# Patient Record
Sex: Female | Born: 2000 | Race: White | Hispanic: No | Marital: Married | Smoking: Never smoker
Health system: Southern US, Community
[De-identification: ages and names within clinical notes are randomized; demographics above are authoritative.]

## PROBLEM LIST (undated history)

## (undated) DIAGNOSIS — J189 Pneumonia, unspecified organism: Secondary | ICD-10-CM

## (undated) DIAGNOSIS — K219 Gastro-esophageal reflux disease without esophagitis: Secondary | ICD-10-CM

## (undated) DIAGNOSIS — F4321 Adjustment disorder with depressed mood: Secondary | ICD-10-CM

## (undated) DIAGNOSIS — J020 Streptococcal pharyngitis: Secondary | ICD-10-CM

## (undated) DIAGNOSIS — J309 Allergic rhinitis, unspecified: Secondary | ICD-10-CM

## (undated) HISTORY — PX: HAND SURGERY: SHX662

## (undated) HISTORY — PX: WISDOM TOOTH EXTRACTION: SHX21
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
## 2019-02-08 ENCOUNTER — Other Ambulatory Visit: Payer: Self-pay

## 2019-02-08 DIAGNOSIS — Z20822 Contact with and (suspected) exposure to covid-19: Secondary | ICD-10-CM

## 2019-02-10 LAB — NOVEL CORONAVIRUS, NAA: SARS-CoV-2, NAA: NOT DETECTED

## 2019-03-03 ENCOUNTER — Encounter (HOSPITAL_COMMUNITY): Payer: Self-pay

## 2019-03-03 ENCOUNTER — Other Ambulatory Visit: Payer: Self-pay

## 2019-03-03 ENCOUNTER — Ambulatory Visit (HOSPITAL_COMMUNITY)
Admission: EM | Admit: 2019-03-03 | Discharge: 2019-03-03 | Disposition: A | Discharge status: Home / Self Care | Payer: Self-pay | Attending: Emergency Medicine | Admitting: Emergency Medicine

## 2019-03-03 DIAGNOSIS — F329 Major depressive disorder, single episode, unspecified: Secondary | ICD-10-CM | POA: Insufficient documentation

## 2019-03-03 DIAGNOSIS — R112 Nausea with vomiting, unspecified: Secondary | ICD-10-CM | POA: Insufficient documentation

## 2019-03-03 DIAGNOSIS — Z20822 Contact with and (suspected) exposure to covid-19: Secondary | ICD-10-CM | POA: Insufficient documentation

## 2019-03-03 DIAGNOSIS — F4321 Adjustment disorder with depressed mood: Secondary | ICD-10-CM | POA: Insufficient documentation

## 2019-03-03 DIAGNOSIS — Z3202 Encounter for pregnancy test, result negative: Secondary | ICD-10-CM

## 2019-03-03 DIAGNOSIS — Z79899 Other long term (current) drug therapy: Secondary | ICD-10-CM | POA: Insufficient documentation

## 2019-03-03 LAB — POCT PREGNANCY, URINE: Preg Test, Ur: NEGATIVE

## 2019-03-03 LAB — POCT URINALYSIS DIP (DEVICE)
Bilirubin Urine: NEGATIVE
Glucose, UA: NEGATIVE mg/dL
Ketones, ur: NEGATIVE mg/dL
Nitrite: NEGATIVE
Protein, ur: NEGATIVE mg/dL
Specific Gravity, Urine: >=1.03 (ref 1.005–1.030)
Urobilinogen, UA: 0.2 mg/dL (ref 0.0–1.0)
pH: 6 (ref 5.0–8.0)

## 2019-03-03 LAB — POC URINE PREG, ED: Preg Test, Ur: NEGATIVE

## 2019-03-03 MED ORDER — ONDANSETRON 4 MG PO TBDP
ORAL_TABLET | ORAL | Status: AC
  Filled 2019-03-03: qty 1

## 2019-03-03 MED ORDER — ONDANSETRON 4 MG PO TBDP
4.0000 mg | ORAL_TABLET | Freq: Once | ORAL | Status: AC
  Administered 2019-03-03: 15:00:00 4 mg via ORAL

## 2019-03-03 MED ORDER — ONDANSETRON 4 MG PO TBDP: 4.0000 mg | ORAL_TABLET | Freq: Three times a day (TID) | ORAL | 0 refills | Status: DC | PRN
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
## 2019-03-03 NOTE — ED Triage Notes (Addendum)
Pt has been vomiting for two days, she unable to keep any food down. Pt was tested two weeks ago for covid 19, test results negative.

## 2019-03-03 NOTE — Discharge Instructions (Signed)
Zofran every 8 hours as needed for nausea or vomiting.  Small frequent sips of fluids- Pedialyte, Gatorade, water, broth- to maintain hydration.   I have sent your urine to be cultured to ensure no infection present.  Will notify you of any positive findings and if any changes to treatment are needed.   Self isolate until covid results are back and negative.  Will notify you by phone of any positive findings. Your negative results will be sent through your MyChart.     If any worsening of pain, frequent vomiting, fevers, or otherwise worsening please return.

## 2019-03-05 LAB — URINE CULTURE: Culture: >=100000 — AB

## 2019-03-05 LAB — NOVEL CORONAVIRUS, NAA (HOSP ORDER, SEND-OUT TO REF LAB; TAT 18-24 HRS): SARS-CoV-2, NAA: NOT DETECTED

## 2019-03-06 ENCOUNTER — Encounter (HOSPITAL_COMMUNITY): Payer: Self-pay

## 2019-03-06 ENCOUNTER — Telehealth (HOSPITAL_COMMUNITY): Payer: Self-pay | Admitting: Emergency Medicine

## 2019-03-06 MED ORDER — CEPHALEXIN 500 MG PO CAPS: 500.0000 mg | ORAL_CAPSULE | Freq: Two times a day (BID) | ORAL | 0 refills | Status: AC

## 2019-05-03 ENCOUNTER — Other Ambulatory Visit: Payer: Self-pay

## 2019-05-03 ENCOUNTER — Encounter (HOSPITAL_COMMUNITY): Payer: Self-pay | Admitting: Emergency Medicine

## 2019-05-03 ENCOUNTER — Emergency Department (HOSPITAL_COMMUNITY)
Admission: EM | Admit: 2019-05-03 | Discharge: 2019-05-03 | Disposition: A | Discharge status: Home / Self Care | Payer: Self-pay | Attending: Emergency Medicine | Admitting: Emergency Medicine

## 2019-05-03 DIAGNOSIS — R110 Nausea: Secondary | ICD-10-CM | POA: Insufficient documentation

## 2019-05-03 DIAGNOSIS — R350 Frequency of micturition: Secondary | ICD-10-CM | POA: Insufficient documentation

## 2019-05-03 DIAGNOSIS — R10815 Periumbilic abdominal tenderness: Secondary | ICD-10-CM | POA: Insufficient documentation

## 2019-05-03 DIAGNOSIS — Z793 Long term (current) use of hormonal contraceptives: Secondary | ICD-10-CM | POA: Insufficient documentation

## 2019-05-03 DIAGNOSIS — N3001 Acute cystitis with hematuria: Secondary | ICD-10-CM | POA: Insufficient documentation

## 2019-05-03 LAB — CBC
HCT: 47.6 % — ABNORMAL HIGH (ref 36.0–46.0)
Hemoglobin: 16 g/dL — ABNORMAL HIGH (ref 12.0–15.0)
MCH: 29.1 pg (ref 26.0–34.0)
MCHC: 33.6 g/dL (ref 30.0–36.0)
MCV: 86.5 fL (ref 80.0–100.0)
Platelets: 365 10*3/uL (ref 150–400)
RBC: 5.5 MIL/uL — ABNORMAL HIGH (ref 3.87–5.11)
RDW: 13.1 % (ref 11.5–15.5)
WBC: 13.2 10*3/uL — ABNORMAL HIGH (ref 4.0–10.5)
nRBC: 0 % (ref 0.0–0.2)

## 2019-05-03 LAB — COMPREHENSIVE METABOLIC PANEL
ALT: 35 U/L (ref 0–44)
AST: 23 U/L (ref 15–41)
Albumin: 4.4 g/dL (ref 3.5–5.0)
Alkaline Phosphatase: 73 U/L (ref 38–126)
Anion gap: 12 (ref 5–15)
BUN: 13 mg/dL (ref 6–20)
CO2: 23 mmol/L (ref 22–32)
Calcium: 9.8 mg/dL (ref 8.9–10.3)
Chloride: 103 mmol/L (ref 98–111)
Creatinine, Ser: 0.92 mg/dL (ref 0.44–1.00)
GFR calc Af Amer: >60 mL/min (ref >60)
GFR calc non Af Amer: >60 mL/min (ref >60)
Glucose, Bld: 90 mg/dL (ref 70–99)
Potassium: 3.7 mmol/L (ref 3.5–5.1)
Sodium: 138 mmol/L (ref 135–145)
Total Bilirubin: 0.6 mg/dL (ref 0.3–1.2)
Total Protein: 8 g/dL (ref 6.5–8.1)

## 2019-05-03 LAB — URINALYSIS, ROUTINE W REFLEX MICROSCOPIC
Bacteria, UA: NONE SEEN
Bilirubin Urine: NEGATIVE
Glucose, UA: NEGATIVE mg/dL
Ketones, ur: NEGATIVE mg/dL
Nitrite: NEGATIVE
Protein, ur: NEGATIVE mg/dL
Specific Gravity, Urine: 1.018 (ref 1.005–1.030)
pH: 6 (ref 5.0–8.0)

## 2019-05-03 LAB — LIPASE, BLOOD: Lipase: 21 U/L (ref 11–51)

## 2019-05-03 LAB — I-STAT BETA HCG BLOOD, ED (MC, WL, AP ONLY): I-stat hCG, quantitative: <5 m[IU]/mL (ref <5)

## 2019-05-03 MED ORDER — CEPHALEXIN 500 MG PO CAPS: 500.0000 mg | ORAL_CAPSULE | Freq: Four times a day (QID) | ORAL | 0 refills | Status: DC

## 2019-05-03 MED ORDER — CEPHALEXIN 250 MG PO CAPS
500.0000 mg | ORAL_CAPSULE | Freq: Once | ORAL | Status: DC
  Filled 2019-05-03: qty 2

## 2019-05-03 MED ORDER — SODIUM CHLORIDE 0.9% FLUSH: 3.0000 mL | Freq: Once | INTRAVENOUS | Status: DC

## 2019-05-03 NOTE — Discharge Instructions (Addendum)
Follow up with your doctor as we discussed to have your urine retested after you complete the 7-day antibiotic course as prescribed for recurrent bladder infection.   As requested, gynecologic/obstetric referrals have been provided regarding your concerns about fertility.

## 2019-05-03 NOTE — ED Triage Notes (Signed)
Patient reports mid/right abdominal pain onset last week , denies emesis or diarrhea , no fever or chills .
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
## 2019-05-05 LAB — URINE CULTURE: Culture: 40000 — AB

## 2019-05-06 ENCOUNTER — Telehealth: Payer: Self-pay | Admitting: Emergency Medicine

## 2019-05-06 NOTE — Telephone Encounter (Signed)
Post ED Visit - Positive Culture Follow-up  Culture report reviewed by antimicrobial stewardship pharmacist: [facility] []  , Pharm.D. []  Enzo Bi, .D., BCPS AQ-ID []  Celedonio Miyamoto, Pharm.D., BCPS []  [address], .D., BCPS []  [place], .D., BCPS, AAHIVP []  Georgina Pillion, Pharm.D., BCPS, AAHIVP [x]  [address], PharmD, BCPS []  , PharmD, BCPS []  [place], PharmD, BCPS []  [address], PharmD []  , PharmD, BCPS []  Estella Husk, PharmD  Pharmacy Team []  Lysle Pearl, PharmD []  , PharmD []  Phillips Climes, PharmD []  , Rph []  Agapito Games) , PharmD []  Verlan Friends, PharmD []  , PharmD []  Mervyn Gay, PharmD []  , PharmD []  Vinnie Level, PharmD []  [facility], PharmD []  , PharmD []  Len Childs, PharmD   Positive urine culture Treated with Cephalexin, organism sensitive to the same and no further patient follow-up is required at this time.  Brayam Boeke 05/06/2019, 12:46 PM

## 2019-08-01 ENCOUNTER — Other Ambulatory Visit: Payer: Self-pay

## 2019-08-01 ENCOUNTER — Emergency Department (HOSPITAL_COMMUNITY)
Admission: EM | Admit: 2019-08-01 | Discharge: 2019-08-01 | Disposition: A | Discharge status: Home / Self Care | Payer: Self-pay | Attending: Emergency Medicine | Admitting: Emergency Medicine

## 2019-08-01 ENCOUNTER — Telehealth: Payer: Self-pay

## 2019-08-01 DIAGNOSIS — R070 Pain in throat: Secondary | ICD-10-CM | POA: Insufficient documentation

## 2019-08-01 DIAGNOSIS — Z5321 Procedure and treatment not carried out due to patient leaving prior to being seen by health care provider: Secondary | ICD-10-CM | POA: Insufficient documentation

## 2019-08-01 NOTE — Telephone Encounter (Signed)
Patient calls nurse line stating she believes she has mono. Patient reports a "very red sore throat." Patient reports no fever, cough, or SOB. Patient reports a recent positive Mono contact. Patient is requesting a mono test and antibiotic. Patient advised we do not do Mono swabs here, and symptom management verses antibiotic is preferred. Patient would like any recommendations from PCP.

## 2019-08-01 NOTE — ED Triage Notes (Signed)
While triaging pt, states she will come back tomorrow due to wait time.

## 2019-08-02 ENCOUNTER — Encounter (HOSPITAL_COMMUNITY): Payer: Self-pay | Admitting: Emergency Medicine

## 2019-08-02 ENCOUNTER — Emergency Department (HOSPITAL_COMMUNITY)
Admission: EM | Admit: 2019-08-02 | Discharge: 2019-08-02 | Disposition: A | Discharge status: Home / Self Care | Payer: Self-pay | Attending: Emergency Medicine | Admitting: Emergency Medicine

## 2019-08-02 DIAGNOSIS — N760 Acute vaginitis: Secondary | ICD-10-CM | POA: Insufficient documentation

## 2019-08-02 DIAGNOSIS — J029 Acute pharyngitis, unspecified: Secondary | ICD-10-CM | POA: Insufficient documentation

## 2019-08-02 DIAGNOSIS — B9689 Other specified bacterial agents as the cause of diseases classified elsewhere: Secondary | ICD-10-CM

## 2019-08-02 LAB — URINALYSIS, ROUTINE W REFLEX MICROSCOPIC
Bilirubin Urine: NEGATIVE
Glucose, UA: NEGATIVE mg/dL
Ketones, ur: 20 mg/dL — AB
Nitrite: NEGATIVE
Protein, ur: 30 mg/dL — AB
Specific Gravity, Urine: 1.03 (ref 1.005–1.030)
pH: 5 (ref 5.0–8.0)

## 2019-08-02 LAB — WET PREP, GENITAL
Sperm: NONE SEEN
Trich, Wet Prep: NONE SEEN
Yeast Wet Prep HPF POC: NONE SEEN

## 2019-08-02 LAB — RAPID HIV SCREEN (HIV 1/2 AB+AG)
HIV 1/2 Antibodies: NONREACTIVE
HIV-1 P24 Antigen - HIV24: NONREACTIVE

## 2019-08-02 LAB — CBC WITH DIFFERENTIAL/PLATELET
Abs Immature Granulocytes: 0.01 10*3/uL (ref 0.00–0.07)
Basophils Absolute: 0.1 10*3/uL (ref 0.0–0.1)
Basophils Relative: 1 %
Eosinophils Absolute: 0.3 10*3/uL (ref 0.0–0.5)
Eosinophils Relative: 3 %
HCT: 46.9 % — ABNORMAL HIGH (ref 36.0–46.0)
Hemoglobin: 15.4 g/dL — ABNORMAL HIGH (ref 12.0–15.0)
Immature Granulocytes: 0 %
Lymphocytes Relative: 28 %
Lymphs Abs: 2.1 10*3/uL (ref 0.7–4.0)
MCH: 28.9 pg (ref 26.0–34.0)
MCHC: 32.8 g/dL (ref 30.0–36.0)
MCV: 88.2 fL (ref 80.0–100.0)
Monocytes Absolute: 1.1 10*3/uL — ABNORMAL HIGH (ref 0.1–1.0)
Monocytes Relative: 14 %
Neutro Abs: 4.1 10*3/uL (ref 1.7–7.7)
Neutrophils Relative %: 54 %
Platelets: 302 10*3/uL (ref 150–400)
RBC: 5.32 MIL/uL — ABNORMAL HIGH (ref 3.87–5.11)
RDW: 13.3 % (ref 11.5–15.5)
WBC: 7.6 10*3/uL (ref 4.0–10.5)
nRBC: 0 % (ref 0.0–0.2)

## 2019-08-02 LAB — GROUP A STREP BY PCR: Group A Strep by PCR: NOT DETECTED

## 2019-08-02 LAB — HIV ANTIBODY (ROUTINE TESTING W REFLEX): HIV Screen 4th Generation wRfx: NONREACTIVE

## 2019-08-02 LAB — MONONUCLEOSIS SCREEN: Mono Screen: NEGATIVE

## 2019-08-02 LAB — PREGNANCY, URINE: Preg Test, Ur: NEGATIVE

## 2019-08-02 MED ORDER — CEFTRIAXONE SODIUM 500 MG IJ SOLR
500.0000 mg | Freq: Once | INTRAMUSCULAR | Status: AC
  Administered 2019-08-02: 500 mg via INTRAMUSCULAR
  Filled 2019-08-02: qty 500

## 2019-08-02 MED ORDER — LIDOCAINE HCL (PF) 1 % IJ SOLN
INTRAMUSCULAR | Status: AC
  Administered 2019-08-02: 1 mL
  Filled 2019-08-02: qty 5

## 2019-08-02 MED ORDER — BUFFERED LIDOCAINE (PF) 1% IJ SOSY
0.2500 mL | PREFILLED_SYRINGE | INTRAMUSCULAR | Status: DC | PRN
  Filled 2019-08-02: qty 10

## 2019-08-02 MED ORDER — METRONIDAZOLE 500 MG PO TABS
500.0000 mg | ORAL_TABLET | Freq: Two times a day (BID) | ORAL | 0 refills | Status: DC
Start: 2019-08-02 — End: 2020-01-30

## 2019-08-02 MED ORDER — AZITHROMYCIN 1 G PO PACK
1.0000 g | PACK | Freq: Once | ORAL | Status: AC
  Administered 2019-08-02: 1 g via ORAL
  Filled 2019-08-02: qty 1

## 2019-08-02 NOTE — Telephone Encounter (Signed)
Agree with going to urgent care for the mono test to confirm the diagnosis.  Myrene Buddy MD PGY-3 Family Medicine Resident
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
## 2019-08-02 NOTE — ED Triage Notes (Signed)
Pt reports 3-4 days or sore throat and cough. Partner has mono so pt would like to be tested.

## 2019-08-03 ENCOUNTER — Telehealth (HOSPITAL_COMMUNITY): Payer: Self-pay

## 2019-08-03 LAB — URINE CULTURE: Culture: <10000 — AB

## 2019-08-03 NOTE — Telephone Encounter (Signed)
Spoke with pt. Concerning her GC/CH swab and that she will need to return to have another one completed.  She verbalized understanding and all questions answered.

## 2019-09-19 ENCOUNTER — Ambulatory Visit (HOSPITAL_COMMUNITY)
Admission: EM | Admit: 2019-09-19 | Discharge: 2019-09-19 | Disposition: A | Discharge status: Home / Self Care | Payer: Self-pay | Attending: Family Medicine | Admitting: Family Medicine

## 2019-09-19 ENCOUNTER — Encounter (HOSPITAL_COMMUNITY): Payer: Self-pay | Admitting: Emergency Medicine

## 2019-09-19 ENCOUNTER — Other Ambulatory Visit: Payer: Self-pay

## 2019-09-19 DIAGNOSIS — R112 Nausea with vomiting, unspecified: Secondary | ICD-10-CM

## 2019-09-19 DIAGNOSIS — R197 Diarrhea, unspecified: Secondary | ICD-10-CM

## 2019-09-19 NOTE — ED Triage Notes (Signed)
PT reports congestion, cough, "headcold", and vomiting started yesterday. Believes it is food poisoning as she ate [employer] prior to onset.   Has not had COVID or vaccines. Offered testing, refused stating she was tested a few weeks ago. Discussed testing for new onset of symptoms, refuses at this time, will talk to provider about it.

## 2019-09-19 NOTE — Discharge Instructions (Signed)
Most likely food poisoning Drink fluids and stay hydrated.  Small bland meals for now Follow up as needed for continued or worsening symptoms
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
## 2019-10-30 ENCOUNTER — Emergency Department (HOSPITAL_COMMUNITY): Payer: Self-pay

## 2019-10-30 ENCOUNTER — Other Ambulatory Visit: Payer: Self-pay

## 2019-10-30 ENCOUNTER — Encounter (HOSPITAL_COMMUNITY): Payer: Self-pay | Admitting: Emergency Medicine

## 2019-10-30 ENCOUNTER — Emergency Department (HOSPITAL_COMMUNITY)
Admission: EM | Admit: 2019-10-30 | Discharge: 2019-10-30 | Disposition: A | Discharge status: Home / Self Care | Payer: Self-pay | Attending: Emergency Medicine | Admitting: Emergency Medicine

## 2019-10-30 DIAGNOSIS — S8392XA Sprain of unspecified site of left knee, initial encounter: Secondary | ICD-10-CM | POA: Insufficient documentation

## 2019-10-30 DIAGNOSIS — W1839XA Other fall on same level, initial encounter: Secondary | ICD-10-CM | POA: Insufficient documentation

## 2019-10-30 DIAGNOSIS — Y999 Unspecified external cause status: Secondary | ICD-10-CM | POA: Insufficient documentation

## 2019-10-30 DIAGNOSIS — Y939 Activity, unspecified: Secondary | ICD-10-CM | POA: Insufficient documentation

## 2019-10-30 DIAGNOSIS — Y929 Unspecified place or not applicable: Secondary | ICD-10-CM | POA: Insufficient documentation

## 2019-10-30 DIAGNOSIS — Z79899 Other long term (current) drug therapy: Secondary | ICD-10-CM | POA: Insufficient documentation

## 2019-10-30 NOTE — Discharge Instructions (Signed)
You can take Tylenol or Ibuprofen as directed for pain. You can alternate Tylenol and Ibuprofen every 4 hours. If you take Tylenol at 1pm, then you can take Ibuprofen at 5pm. Then you can take Tylenol again at 9pm.   Follow the RICE (Rest, Ice, Compression, Elevation) protocol as directed.   Wear the knee immobilizer for support and stabilization.  Use crutches for support.  Follow-up with referred orthopedic doctor.  Call their office arrange for an appointment.
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
## 2019-10-30 NOTE — ED Triage Notes (Signed)
Pt. Stated, I was roller skating last night ad fell and Ive ad left knee pain.

## 2019-12-20 ENCOUNTER — Emergency Department (HOSPITAL_COMMUNITY)
Admission: EM | Admit: 2019-12-20 | Discharge: 2019-12-20 | Disposition: A | Discharge status: Home / Self Care | Payer: Self-pay | Attending: Emergency Medicine | Admitting: Emergency Medicine

## 2019-12-20 ENCOUNTER — Other Ambulatory Visit: Payer: Self-pay

## 2019-12-20 ENCOUNTER — Telehealth (HOSPITAL_COMMUNITY): Payer: Self-pay | Admitting: Emergency Medicine

## 2019-12-20 ENCOUNTER — Encounter (HOSPITAL_COMMUNITY): Payer: Self-pay | Admitting: Emergency Medicine

## 2019-12-20 DIAGNOSIS — R399 Unspecified symptoms and signs involving the genitourinary system: Secondary | ICD-10-CM

## 2019-12-20 DIAGNOSIS — R112 Nausea with vomiting, unspecified: Secondary | ICD-10-CM | POA: Insufficient documentation

## 2019-12-20 DIAGNOSIS — N390 Urinary tract infection, site not specified: Secondary | ICD-10-CM | POA: Insufficient documentation

## 2019-12-20 DIAGNOSIS — R197 Diarrhea, unspecified: Secondary | ICD-10-CM | POA: Insufficient documentation

## 2019-12-20 DIAGNOSIS — R1030 Lower abdominal pain, unspecified: Secondary | ICD-10-CM | POA: Insufficient documentation

## 2019-12-20 LAB — CBC
HCT: 47.1 % — ABNORMAL HIGH (ref 36.0–46.0)
Hemoglobin: 14.9 g/dL (ref 12.0–15.0)
MCH: 29.3 pg (ref 26.0–34.0)
MCHC: 31.6 g/dL (ref 30.0–36.0)
MCV: 92.7 fL (ref 80.0–100.0)
Platelets: 319 10*3/uL (ref 150–400)
RBC: 5.08 MIL/uL (ref 3.87–5.11)
RDW: 13.8 % (ref 11.5–15.5)
WBC: 13.8 10*3/uL — ABNORMAL HIGH (ref 4.0–10.5)
nRBC: 0 % (ref 0.0–0.2)

## 2019-12-20 LAB — URINALYSIS, ROUTINE W REFLEX MICROSCOPIC
Bilirubin Urine: NEGATIVE
Bilirubin Urine: NEGATIVE
Glucose, UA: NEGATIVE mg/dL
Glucose, UA: NEGATIVE mg/dL
Hgb urine dipstick: NEGATIVE
Hgb urine dipstick: NEGATIVE
Ketones, ur: NEGATIVE mg/dL
Ketones, ur: NEGATIVE mg/dL
Nitrite: NEGATIVE
Nitrite: NEGATIVE
Protein, ur: 30 mg/dL — AB
Protein, ur: NEGATIVE mg/dL
Specific Gravity, Urine: 1.027 (ref 1.005–1.030)
Specific Gravity, Urine: 1.027 (ref 1.005–1.030)
WBC, UA: >50 WBC/hpf — ABNORMAL HIGH (ref 0–5)
pH: 5 (ref 5.0–8.0)
pH: 5 (ref 5.0–8.0)

## 2019-12-20 LAB — COMPREHENSIVE METABOLIC PANEL
ALT: 15 U/L (ref 0–44)
AST: 17 U/L (ref 15–41)
Albumin: 4.2 g/dL (ref 3.5–5.0)
Alkaline Phosphatase: 59 U/L (ref 38–126)
Anion gap: 11 (ref 5–15)
BUN: 13 mg/dL (ref 6–20)
CO2: 25 mmol/L (ref 22–32)
Calcium: 9.5 mg/dL (ref 8.9–10.3)
Chloride: 103 mmol/L (ref 98–111)
Creatinine, Ser: 0.86 mg/dL (ref 0.44–1.00)
GFR, Estimated: >60 mL/min (ref >60)
Glucose, Bld: 92 mg/dL (ref 70–99)
Potassium: 3.6 mmol/L (ref 3.5–5.1)
Sodium: 139 mmol/L (ref 135–145)
Total Bilirubin: 0.6 mg/dL (ref 0.3–1.2)
Total Protein: 7.3 g/dL (ref 6.5–8.1)

## 2019-12-20 LAB — LIPASE, BLOOD: Lipase: 29 U/L (ref 11–51)

## 2019-12-20 LAB — I-STAT BETA HCG BLOOD, ED (MC, WL, AP ONLY): I-stat hCG, quantitative: <5 m[IU]/mL (ref <5)

## 2019-12-20 MED ORDER — ONDANSETRON HCL 4 MG PO TABS
4.0000 mg | ORAL_TABLET | Freq: Three times a day (TID) | ORAL | 0 refills | Status: DC | PRN
Start: 2019-12-20 — End: 2020-03-07

## 2019-12-20 MED ORDER — OMEPRAZOLE 40 MG PO CPDR
40.0000 mg | DELAYED_RELEASE_CAPSULE | Freq: Every day | ORAL | 0 refills | Status: DC
Start: 2019-12-20 — End: 2020-03-07

## 2019-12-20 MED ORDER — ONDANSETRON 4 MG PO TBDP
4.0000 mg | ORAL_TABLET | Freq: Once | ORAL | Status: AC
  Administered 2019-12-20: 4 mg via ORAL
  Filled 2019-12-20: qty 1

## 2019-12-20 MED ORDER — SODIUM CHLORIDE 0.9 % IV BOLUS: 1000.0000 mL | Freq: Once | INTRAVENOUS | Status: DC

## 2019-12-20 MED ORDER — ONDANSETRON HCL 4 MG/2ML IJ SOLN: 4.0000 mg | Freq: Once | INTRAMUSCULAR | Status: DC

## 2019-12-20 MED ORDER — CEPHALEXIN 500 MG PO CAPS: 500.0000 mg | ORAL_CAPSULE | Freq: Two times a day (BID) | ORAL | 0 refills | Status: DC

## 2019-12-20 MED ORDER — ALUM & MAG HYDROXIDE-SIMETH 200-200-20 MG/5ML PO SUSP
30.0000 mL | Freq: Once | ORAL | Status: AC
  Administered 2019-12-20: 30 mL via ORAL
  Filled 2019-12-20: qty 30

## 2019-12-20 MED ORDER — LIDOCAINE VISCOUS HCL 2 % MT SOLN
15.0000 mL | Freq: Once | OROMUCOSAL | Status: AC
  Administered 2019-12-20: 15 mL via ORAL
  Filled 2019-12-20: qty 15

## 2019-12-20 NOTE — ED Triage Notes (Signed)
Pt reports abdominal pain and emesis X3 days accompanied by symptoms of a UTI (hx).

## 2019-12-20 NOTE — Discharge Instructions (Addendum)
You were seen in the ED for nausea, vomiting and diarrhea.  You were unable to provide a stool sample for testing.  However, symptoms are likely from a virus.  You should start taking omeprazole 40 mg every morning for acid reflux as well.  Avoid alcohol, ibuprofen or aspirin products.  Zofran for nausea as needed.  Stay hydrated.  Return to the ED for worsening symptoms, fevers, severe abdominal pain  Initial urinalysis was a poor sample, you left prior to the second urinalysis results.  You will be contacted if urinalysis is obviously showing an infection.  A urine culture will also be sent to confirm/rule out an infection.

## 2019-12-20 NOTE — Telephone Encounter (Signed)
Reviewed patient's urinalysis looks minimally suspicious for UTI. Mucus, 6-10 WBC, few bacteria , trace leukocytes. No RBCs, nitrites.  Given symptoms low threshold to start Keflex. Urine culture pending.  Attempted to call patient to notify but unable to leave voicemail - box full. Will send prescription for keflex to pharmacy on record.

## 2019-12-20 NOTE — ED Notes (Signed)
Patient verbalizes understanding of discharge instructions. Opportunity for questioning and answers were provided. Arm band removed by staff, patient discharged from ED. 
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
## 2020-01-30 ENCOUNTER — Encounter (HOSPITAL_COMMUNITY): Payer: Self-pay | Admitting: Emergency Medicine

## 2020-01-30 ENCOUNTER — Emergency Department (HOSPITAL_COMMUNITY)
Admission: EM | Admit: 2020-01-30 | Discharge: 2020-01-30 | Disposition: A | Discharge status: Home / Self Care | Payer: Self-pay | Attending: Emergency Medicine | Admitting: Emergency Medicine

## 2020-01-30 ENCOUNTER — Other Ambulatory Visit: Payer: Self-pay

## 2020-01-30 DIAGNOSIS — H6690 Otitis media, unspecified, unspecified ear: Secondary | ICD-10-CM

## 2020-01-30 DIAGNOSIS — H6692 Otitis media, unspecified, left ear: Secondary | ICD-10-CM | POA: Insufficient documentation

## 2020-01-30 MED ORDER — IBUPROFEN 600 MG PO TABS: 600.0000 mg | ORAL_TABLET | Freq: Four times a day (QID) | ORAL | 0 refills | Status: DC | PRN

## 2020-01-30 MED ORDER — AMOXICILLIN 500 MG PO CAPS: 500.0000 mg | ORAL_CAPSULE | Freq: Three times a day (TID) | ORAL | 0 refills | Status: DC
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
## 2020-01-30 NOTE — ED Triage Notes (Signed)
Patient here with left ear pain.  She states that she was trying to clean her ear yesterday, used a Qtip, now with pain and can only hear muffled sounds.  She does have a URI so she suspects she may have an ear infection.

## 2020-01-31 ENCOUNTER — Telehealth: Payer: Self-pay

## 2020-01-31 NOTE — Telephone Encounter (Signed)
Patient LVM on nurse line regarding concerns for medication management.   Called patient back to discuss further. Patient was seen in ED yesterday, diagnosed with middle ear infection. Patient has taken amoxicillin x2, vomited each time that she took the medication. Last dosage patient began having shaking in arms and legs. Patient called 911 and was advised to stop taking abx and to call PCP for new antibiotic.   Patient is currently stable. ED precautions were given.   To PCP.   Please advise.   Veronda Prude, RN

## 2020-02-01 ENCOUNTER — Other Ambulatory Visit (HOSPITAL_COMMUNITY)
Admission: RE | Admit: 2020-02-01 | Discharge: 2020-02-01 | Disposition: A | Discharge status: Home / Self Care | Payer: Self-pay | Source: Ambulatory Visit | Attending: Family Medicine | Admitting: Family Medicine

## 2020-02-01 ENCOUNTER — Encounter: Payer: Self-pay | Admitting: Family Medicine

## 2020-02-01 ENCOUNTER — Ambulatory Visit (INDEPENDENT_AMBULATORY_CARE_PROVIDER_SITE_OTHER): Payer: Self-pay | Admitting: Family Medicine

## 2020-02-01 ENCOUNTER — Other Ambulatory Visit: Payer: Self-pay

## 2020-02-01 VITALS — BP 122/76 | HR 103 | Ht 60.0 in | Wt 145.8 lb

## 2020-02-01 DIAGNOSIS — R350 Frequency of micturition: Secondary | ICD-10-CM | POA: Insufficient documentation

## 2020-02-01 DIAGNOSIS — H6690 Otitis media, unspecified, unspecified ear: Secondary | ICD-10-CM

## 2020-02-01 DIAGNOSIS — J029 Acute pharyngitis, unspecified: Secondary | ICD-10-CM

## 2020-02-01 DIAGNOSIS — R1031 Right lower quadrant pain: Secondary | ICD-10-CM

## 2020-02-01 LAB — POCT URINALYSIS DIP (MANUAL ENTRY)
Blood, UA: NEGATIVE
Glucose, UA: NEGATIVE mg/dL
Ketones, POC UA: NEGATIVE mg/dL
Leukocytes, UA: NEGATIVE
Nitrite, UA: NEGATIVE
Protein Ur, POC: NEGATIVE mg/dL
Spec Grav, UA: >=1.03 — AB (ref 1.010–1.025)
Urobilinogen, UA: 0.2 E.U./dL
pH, UA: 5.5 (ref 5.0–8.0)

## 2020-02-01 LAB — POCT URINE PREGNANCY: Preg Test, Ur: NEGATIVE

## 2020-02-01 LAB — POCT RAPID STREP A (OFFICE): Rapid Strep A Screen: NEGATIVE

## 2020-02-01 MED ORDER — DOXYCYCLINE HYCLATE 100 MG PO TABS: 100.0000 mg | ORAL_TABLET | Freq: Two times a day (BID) | ORAL | 0 refills | Status: AC

## 2020-02-01 NOTE — Patient Instructions (Signed)
It was nice to meet you today,   Your pregnancy test and strep test were negative.  The vaginal swab test is still pending.  I am treating you with doxycycline for your ear infection.  Please take it twice a day for seven days.    If your abdominal pain becomes more severe you should seek a medical provider to make sure you don't have an ovarian torsion.    Have a good day,   Frederic Jericho, MD

## 2020-02-01 NOTE — Progress Notes (Signed)
    SUBJECTIVE:   CHIEF COMPLAINT / HPI:   Ear infection:  Took two doses of amoxicillin before she developed cold sweats, vomiting, and lightheadedness.  'sorta kinda' had trouble breathing when speaking at that time.  No swelling of the face.  No rash.  Still having burning pain and leaking of her left ear as well as decreased hearing.    Sore throat: started two days ago after taking the amoxicillin.  Also having productive cough.  Painful swallowing and painful to talk.  103*F two days ago that lasted 30 mnutes.  takng omeprazole and iburpron  Possible UTI: sharp pain in lower abdomen, but not associated with urination.  Gets similar abdominal pain every month before her menstrual cycle.  Also having increased urination.  Sexually active with one female partner.  Last period was November 3.  Has irregular periods.    PERTINENT  PMH / PSH:   OBJECTIVE:   BP 122/76   Pulse (!) 103   Ht 5' (1.524 m)   Wt 145 lb 12.8 oz (66.1 kg)   LMP 01/03/2020 (Exact Date)   SpO2 98%   BMI 28.47 kg/m   General: Alert and oriented.  No acute distress HEENT: Inflammation/erythema of the left tympanic membrane.  Right TM normal.  No oral pharyngeal erythema or exudates CV: Regular rate and rhythm, no murmurs Pulmonary: Lungs are auscultation bilaterally Abdomen: Soft, tender to palpation in the left lower quadrant and right lower quadrant. GU: No vaginal lesions appreciated.  No vaginal bleeding.  Negative for cervical motion tenderness.  Positive adnexal tenderness bilaterally.  ASSESSMENT/PLAN:   Sore throat Strep test negative.  Most likely viral pharyngitis.  Patient unvaccinated.  Advised her to get tested for Covid given her cough and sore throat.  Patient declined Covid vaccination  Right lower quadrant abdominal pain Similar to the pain she feels before her menstrual period.  Has been approximately 1 month since last menstruation.  Cervical motion tenderness negative, but does have  bilateral adnexal tenderness.  Possibly ovarian cyst, unlikely to be ovarian torsion given the mildness of her pain but patient advised to return to clinic or seek medical attention if pain becomes more severe.  We will get gonorrhea and Chlamydia testing today to rule out PID.  Being treated with doxycycline for acute otitis media which should cover for chlamydia.  Urinalysis did not show any signs of infection.  Urine pregnancy test was negative.  Acute otitis media Allergy to amoxicillin involving fever and vomiting.  Appears to still have symptoms and left ear tympanic membrane appears erythematous.  Will treat with doxycycline instead.     Sandre Kitty, MD [facility]

## 2020-02-03 DIAGNOSIS — R1031 Right lower quadrant pain: Secondary | ICD-10-CM | POA: Insufficient documentation

## 2020-02-03 DIAGNOSIS — H6690 Otitis media, unspecified, unspecified ear: Secondary | ICD-10-CM | POA: Insufficient documentation

## 2020-02-03 DIAGNOSIS — J029 Acute pharyngitis, unspecified: Secondary | ICD-10-CM | POA: Insufficient documentation

## 2020-02-03 NOTE — Assessment & Plan Note (Signed)
Allergy to amoxicillin involving fever and vomiting.  Appears to still have symptoms and left ear tympanic membrane appears erythematous.  Will treat with doxycycline instead.

## 2020-02-03 NOTE — Assessment & Plan Note (Addendum)
Similar to the pain she feels before her menstrual period.  Has been approximately 1 month since last menstruation.  Cervical motion tenderness negative, but does have bilateral adnexal tenderness.  Possibly ovarian cyst, unlikely to be ovarian torsion given the mildness of her pain but patient advised to return to clinic or seek medical attention if pain becomes more severe.  We will get gonorrhea and Chlamydia testing today to rule out PID.  Being treated with doxycycline for acute otitis media which should cover for chlamydia.  Urinalysis did not show any signs of infection.  Urine pregnancy test was negative.

## 2020-02-03 NOTE — Assessment & Plan Note (Signed)
Strep test negative.  Most likely viral pharyngitis.  Patient unvaccinated.  Advised her to get tested for Covid given her cough and sore throat.  Patient declined Covid vaccination

## 2020-02-05 ENCOUNTER — Encounter (HOSPITAL_COMMUNITY): Payer: Self-pay | Admitting: Emergency Medicine

## 2020-02-05 ENCOUNTER — Emergency Department (HOSPITAL_COMMUNITY)
Admission: EM | Admit: 2020-02-05 | Discharge: 2020-02-05 | Disposition: A | Discharge status: Home / Self Care | Payer: Self-pay | Attending: Emergency Medicine | Admitting: Emergency Medicine

## 2020-02-05 DIAGNOSIS — R1013 Epigastric pain: Secondary | ICD-10-CM

## 2020-02-05 DIAGNOSIS — K2900 Acute gastritis without bleeding: Secondary | ICD-10-CM | POA: Insufficient documentation

## 2020-02-05 LAB — CERVICOVAGINAL ANCILLARY ONLY
Chlamydia: NEGATIVE
Comment: NEGATIVE
Comment: NORMAL
Neisseria Gonorrhea: NEGATIVE

## 2020-02-05 MED ORDER — ALBUTEROL SULFATE HFA 108 (90 BASE) MCG/ACT IN AERS
2.0000 | INHALATION_SPRAY | Freq: Once | RESPIRATORY_TRACT | Status: AC
  Administered 2020-02-05: 2 via RESPIRATORY_TRACT
  Filled 2020-02-05: qty 6.7

## 2020-02-05 NOTE — ED Triage Notes (Signed)
Pt here from home with abd pain and n/v from a  New antibiotic along with left ear pain , pt is now on her second med from an allergic reaction from first antibiotic
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
## 2020-03-06 ENCOUNTER — Ambulatory Visit: Payer: Self-pay

## 2020-03-06 NOTE — Telephone Encounter (Signed)
Patient called stating that she has been drinking alchole non stop over the holiday.  She states that she has over the past three days had chest pain   She states that it travels up and down her sternum and across her chest.  She has vomited several times No blood. She states that pain does not travel to back jaw or arm. She has restarted her acid reflux medication.  She states her other symptom is that she is thirsty. No dizziness. Patient denies issues with alcohol.  She states it has just been a holiday thing. Per protocol patient will go to UC today to be evaluated.    Reason for Disposition . [1] Patient says chest pain feels exactly the same as previously diagnosed "heartburn" AND [2] describes burning in chest AND [3] accompanying sour taste in mouth . [1] Chest pain lasts > 5 minutes AND [2] occurred in past 3 days (72 hours) (Exception: feels exactly the same as previously diagnosed heartburn and has accompanying sour taste in mouth)  Answer Assessment - Initial Assessment Questions 1. LOCATION: "Where does it hurt?"       Up and down sternum across chest 2. RADIATION: "Does the pain go anywhere else?" (e.g., into neck, jaw, arms, back)   no 3. ONSET: "When did the chest pain begin?" (Minutes, hours or days)     3 days 4. PATTERN "Does the pain come and go, or has it been constant since it started?"  "Does it get worse with exertion?"      constant 5. DURATION: "How long does it last" (e.g., seconds, minutes, hours)     N/A constant 6. SEVERITY: "How bad is the pain?"  (e.g., Scale 1-10; mild, moderate, or severe)    - MILD (1-3): doesn't interfere with normal activities     - MODERATE (4-7): interferes with normal activities or awakens from sleep    - SEVERE (8-10): excruciating pain, unable to do any normal activities       5-6 7. CARDIAC RISK FACTORS: "Do you have any history of heart problems or risk factors for heart disease?" (e.g., angina, prior heart attack; diabetes, high  blood pressure, high cholesterol, smoker, or strong family history of heart disease)     vaps 8. PULMONARY RISK FACTORS: "Do you have any history of lung disease?"  (e.g., blood clots in lung, asthma, emphysema, birth control pills)    no 9. CAUSE: "What do you think is causing the chest pain?"     Alcohol consumption 10. OTHER SYMPTOMS: "Do you have any other symptoms?" (e.g., dizziness, nausea, vomiting, sweating, fever, difficulty breathing, cough)     Nausea and vomiting 11. PREGNANCY: "Is there any chance you are pregnant?" "When was your last menstrual period?"      Unsure Dec 1  Protocols used: CHEST PAIN-A-AH

## 2020-03-07 ENCOUNTER — Encounter (HOSPITAL_COMMUNITY): Payer: Self-pay

## 2020-03-07 ENCOUNTER — Ambulatory Visit (HOSPITAL_COMMUNITY)
Admission: EM | Admit: 2020-03-07 | Discharge: 2020-03-07 | Disposition: A | Discharge status: Home / Self Care | Payer: Self-pay | Attending: Family Medicine | Admitting: Family Medicine

## 2020-03-07 ENCOUNTER — Other Ambulatory Visit: Payer: Self-pay

## 2020-03-07 ENCOUNTER — Ambulatory Visit (INDEPENDENT_AMBULATORY_CARE_PROVIDER_SITE_OTHER): Payer: Self-pay

## 2020-03-07 DIAGNOSIS — R079 Chest pain, unspecified: Secondary | ICD-10-CM

## 2020-03-07 DIAGNOSIS — R0602 Shortness of breath: Secondary | ICD-10-CM

## 2020-03-07 DIAGNOSIS — K219 Gastro-esophageal reflux disease without esophagitis: Secondary | ICD-10-CM

## 2020-03-07 LAB — POC URINE PREG, ED: Preg Test, Ur: NEGATIVE

## 2020-03-07 MED ORDER — ONDANSETRON HCL 4 MG PO TABS: 4.0000 mg | ORAL_TABLET | Freq: Three times a day (TID) | ORAL | 0 refills | Status: DC | PRN

## 2020-03-07 MED ORDER — OMEPRAZOLE 40 MG PO CPDR: 40.0000 mg | DELAYED_RELEASE_CAPSULE | Freq: Every day | ORAL | 1 refills | Status: DC

## 2020-03-07 NOTE — ED Triage Notes (Addendum)
Pt is here with SOB and chest pain for 4 days after drinking tons of alcohol, pt has not taken any meds to relieve discomfort.
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
## 2020-04-01 ENCOUNTER — Encounter: Payer: Self-pay | Admitting: Student in an Organized Health Care Education/Training Program

## 2020-04-08 ENCOUNTER — Telehealth: Payer: Self-pay | Admitting: Physician Assistant

## 2020-04-08 DIAGNOSIS — Z3200 Encounter for pregnancy test, result unknown: Secondary | ICD-10-CM
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
## 2020-04-09 ENCOUNTER — Other Ambulatory Visit: Payer: Self-pay

## 2020-04-09 ENCOUNTER — Encounter (HOSPITAL_COMMUNITY): Payer: Self-pay | Admitting: Emergency Medicine

## 2020-04-09 ENCOUNTER — Emergency Department (HOSPITAL_COMMUNITY)
Admission: EM | Admit: 2020-04-09 | Discharge: 2020-04-09 | Disposition: A | Discharge status: Home / Self Care | Payer: Self-pay | Attending: Emergency Medicine | Admitting: Emergency Medicine

## 2020-04-09 DIAGNOSIS — N3090 Cystitis, unspecified without hematuria: Secondary | ICD-10-CM

## 2020-04-09 DIAGNOSIS — R519 Headache, unspecified: Secondary | ICD-10-CM

## 2020-04-09 DIAGNOSIS — R112 Nausea with vomiting, unspecified: Secondary | ICD-10-CM | POA: Insufficient documentation

## 2020-04-09 DIAGNOSIS — R197 Diarrhea, unspecified: Secondary | ICD-10-CM

## 2020-04-09 LAB — CBC
HCT: 44.3 % (ref 36.0–46.0)
Hemoglobin: 14.5 g/dL (ref 12.0–15.0)
MCH: 29.3 pg (ref 26.0–34.0)
MCHC: 32.7 g/dL (ref 30.0–36.0)
MCV: 89.5 fL (ref 80.0–100.0)
Platelets: 291 10*3/uL (ref 150–400)
RBC: 4.95 MIL/uL (ref 3.87–5.11)
RDW: 13.9 % (ref 11.5–15.5)
WBC: 6.9 10*3/uL (ref 4.0–10.5)
nRBC: 0 % (ref 0.0–0.2)

## 2020-04-09 LAB — COMPREHENSIVE METABOLIC PANEL
ALT: 21 U/L (ref 0–44)
AST: 17 U/L (ref 15–41)
Albumin: 3.7 g/dL (ref 3.5–5.0)
Alkaline Phosphatase: 52 U/L (ref 38–126)
Anion gap: 10 (ref 5–15)
BUN: 8 mg/dL (ref 6–20)
CO2: 22 mmol/L (ref 22–32)
Calcium: 8.9 mg/dL (ref 8.9–10.3)
Chloride: 107 mmol/L (ref 98–111)
Creatinine, Ser: 0.63 mg/dL (ref 0.44–1.00)
GFR, Estimated: >60 mL/min (ref >60)
Glucose, Bld: 98 mg/dL (ref 70–99)
Potassium: 3.9 mmol/L (ref 3.5–5.1)
Sodium: 139 mmol/L (ref 135–145)
Total Bilirubin: 0.7 mg/dL (ref 0.3–1.2)
Total Protein: 6.8 g/dL (ref 6.5–8.1)

## 2020-04-09 LAB — URINALYSIS, ROUTINE W REFLEX MICROSCOPIC
Bilirubin Urine: NEGATIVE
Glucose, UA: NEGATIVE mg/dL
Hgb urine dipstick: NEGATIVE
Ketones, ur: NEGATIVE mg/dL
Nitrite: NEGATIVE
Protein, ur: NEGATIVE mg/dL
Specific Gravity, Urine: 1.018 (ref 1.005–1.030)
pH: 7 (ref 5.0–8.0)

## 2020-04-09 LAB — LIPASE, BLOOD: Lipase: 24 U/L (ref 11–51)

## 2020-04-09 LAB — I-STAT BETA HCG BLOOD, ED (MC, WL, AP ONLY): I-stat hCG, quantitative: <5 m[IU]/mL (ref <5)

## 2020-04-09 MED ORDER — METOCLOPRAMIDE HCL 5 MG/ML IJ SOLN
10.0000 mg | Freq: Once | INTRAMUSCULAR | Status: AC
  Administered 2020-04-09: 10 mg via INTRAVENOUS
  Filled 2020-04-09: qty 2

## 2020-04-09 MED ORDER — ONDANSETRON 4 MG PO TBDP: 4.0000 mg | ORAL_TABLET | Freq: Three times a day (TID) | ORAL | 0 refills | Status: DC | PRN

## 2020-04-09 MED ORDER — SODIUM CHLORIDE 0.9 % IV BOLUS
1000.0000 mL | Freq: Once | INTRAVENOUS | Status: AC
  Administered 2020-04-09: 1000 mL via INTRAVENOUS

## 2020-04-09 MED ORDER — CEPHALEXIN 500 MG PO CAPS: 500.0000 mg | ORAL_CAPSULE | Freq: Two times a day (BID) | ORAL | 0 refills | Status: AC

## 2020-04-09 MED ORDER — DIPHENHYDRAMINE HCL 50 MG/ML IJ SOLN
12.5000 mg | Freq: Once | INTRAMUSCULAR | Status: AC
  Administered 2020-04-09: 12.5 mg via INTRAVENOUS
  Filled 2020-04-09: qty 1

## 2020-04-09 NOTE — ED Triage Notes (Signed)
Pt arrives to ED via [facility] with multiple complaints she describes having a headache for 1 week, lower abdominal pain for 1 day and would like to be tested for possible pregnancy. She also reports drinking alcohol last night around 5 shots of whiskey.

## 2020-04-09 NOTE — Discharge Instructions (Addendum)
Please follow with your primary care provider. Take the antibiotic, cephalexin, as directed until gone. You can take Zofran every 8 hours as needed for nausea. It is recommended you avoid alcohol as this can aggravate a gastritis. Discuss preventative management of your headaches with your primary care.
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
## 2020-04-11 LAB — URINE CULTURE

## 2020-05-25 ENCOUNTER — Encounter: Payer: Self-pay | Admitting: Student in an Organized Health Care Education/Training Program

## 2020-06-10 ENCOUNTER — Ambulatory Visit: Payer: Self-pay

## 2020-06-11 ENCOUNTER — Other Ambulatory Visit: Payer: Self-pay

## 2020-06-11 ENCOUNTER — Other Ambulatory Visit (HOSPITAL_COMMUNITY)
Admission: RE | Admit: 2020-06-11 | Discharge: 2020-06-11 | Disposition: A | Discharge status: Home / Self Care | Payer: Self-pay | Source: Ambulatory Visit | Attending: Family Medicine | Admitting: Family Medicine

## 2020-06-11 ENCOUNTER — Ambulatory Visit (INDEPENDENT_AMBULATORY_CARE_PROVIDER_SITE_OTHER): Payer: Self-pay | Admitting: Student in an Organized Health Care Education/Training Program

## 2020-06-11 ENCOUNTER — Encounter: Payer: Self-pay | Admitting: Student in an Organized Health Care Education/Training Program

## 2020-06-11 VITALS — BP 112/74 | HR 107 | Wt 166.2 lb

## 2020-06-11 DIAGNOSIS — Z113 Encounter for screening for infections with a predominantly sexual mode of transmission: Secondary | ICD-10-CM

## 2020-06-11 DIAGNOSIS — Z3169 Encounter for other general counseling and advice on procreation: Secondary | ICD-10-CM

## 2020-06-11 NOTE — Progress Notes (Signed)
   SUBJECTIVE:   CHIEF COMPLAINT / HPI: vaginal concerns  Patient went to ED 2/8 and was diagnosed with UTI and given keflex prescription. She did not feel that she had a UTI so she didn't pick up the antibiotic until about 2 weeks ago when she had dysuria. She completed the course 4 days ago. Today, she denies dysuria, hematuria, vaginal discharge, rash, or pruritus. She would like to "get her vagina checked out" and get testing for STDs. Patient declines HIV or syphilus testing Additionally, patient endorses that she is currently on her period.  She has regular menstrual cycle.  She is not on birth control.  She and her partner are sexually active regularly and do not use condoms.  She denies actively trying for pregnancy but is not actively preventing 1 either.  She states that they generally use the pullout method.  She is not on a prenatal vitamin.  OBJECTIVE:   BP 112/74   Pulse (!) 107   Wt 166 lb 3.2 oz (75.4 kg)   SpO2 97%   BMI 31.40 kg/m   Physical Exam Vitals and nursing note reviewed. Exam conducted with a chaperone present.  Constitutional:      General: She is not in acute distress.    Appearance: She is obese. She is not ill-appearing or toxic-appearing.  Cardiovascular:     Rate and Rhythm: Normal rate and regular rhythm.  Pulmonary:     Effort: Pulmonary effort is normal.     Breath sounds: Normal breath sounds.  Genitourinary:    General: Normal vulva.     Exam position: Lithotomy position.     Pubic Area: No rash.      Labia:        Right: No rash.        Left: No rash.      Vagina: Vaginal discharge (thin, clear to grey mucoid, moderate volume) present.     Cervix: Cervical bleeding (from os) present.     Uterus: Normal.   Skin:    Capillary Refill: Capillary refill takes less than 2 seconds.  Neurological:     Mental Status: She is alert.    ASSESSMENT/PLAN:   Routine screening for STI (sexually transmitted infection) Patient denies symptoms, known  contact or h/o STI but requests testing today Pelvic exam positive for mild clear-grey mucoid discharge and scant blood from os but otherwise normal Sent sample for GC/chlam/trich/BV/yeast Patient declined HIV/RPR  Pre-conception counseling Pre-conception counseling including education on indication for prenatal vitamins and her very high risk of becoming pregnant with current lifestyle. She feels she is ready for parenthood     Leeroy Bock, DO [facility]

## 2020-06-11 NOTE — Patient Instructions (Addendum)
It was a pleasure to see you today!  To summarize our discussion for this visit:  Please take a daily prenatal vitamin as you are very likely to become pregnant if sexually active and not using birth control.   We are testing for STIs today.    Some additional health maintenance measures we should update are: Health Maintenance Due  Topic Date Due  . Hepatitis C Screening  Never done  . COVID-19 Vaccine (1) Never done  .   Please return to our clinic to see me for a physical after your birthday.  Call the clinic at [phone] if your symptoms worsen or you have any concerns.   Thank you for allowing me to take part in your care,  Dr. Jamelle Rushing  Prenatal Vitamin and Mineral Combinations (oral solid dosage forms) What is this medicine? PRENATAL VITAMIN AND MINERAL combinations are used before, during, and after pregnancy to help provide provide good nutrition. This medicine may be used for other purposes; ask your health care provider or pharmacist if you have questions. COMMON BRAND NAME(S): BP FoliNatal Plus B, BP MultiNatal Plus, BP MultiNatal Plus Chewable, BP Prenate, Calcium PNV, CareNatal DHA, Cavan Prenatal with EC Calcium, Cavan-Alpha, Cavan-EC SOD DHA, Cavan-Heme OB, Cavan-Heme Omega, Centrum Specialist Prenatal, CertaVite with Antioxidants, Choice-OB + DHA, Citracal Prenatal + DHA, CitraNatal 90 DHA, CitraNatal Assure, CitraNatal DHA, CitraNatal Harmony, ComBi Rx, Complete Natal DHA, Concept DHA, CoreNate-DHA, CRNatal DHA, Duet, Duet Chewable, Duet DHA, Duet DHA 400, Duet DHA 430ec, Duet DHA Balanced, Duet DHA Complete, Duet DHA EC, Duet DHA Ferrazone, EC Omega-3, Elite OB with DHA, Femecal OB Plus DHA, Focalgin 90 DHA, Focalgin CA, Folbecal, Folivane-EC Calcium DHA NF, Foltabs 90 Plus DHA, Foltabs Prenatal, Foltabs Prenatal Plus DHA, Gesticare, Gesticare DHA, Gesticare DHA Delayed-Release, HemeNatal OB + DHA, HIP Prenatal, iNatal Advance, Infanate Balance, Infanate DHA,  Infanate Plus, Kolnatal, Lactocal-F, Levomefolate PNV, MACNATAL CN DHA, Marnatal-F Plus, Maxinate, Mom's Choice Rx, Multi-Nate 30, Multi-Nate 30 DHA, Multi-Nate DHA Extra, Multifol Plus, NataChew, NataFolic-OB, Natal-V RX, NatalCare PIC Forte, NatalCare Plus, NATALVIRT 90 DHA, NATALVIRT CA, Natatab Rx, Natelle C, Natelle One, Natelle Plus with DHA, Navatab + DHA, Neevo, Nestabs ABC, Nestabs DHA, Niva-Plus, NovaNatal, Nutri-Tab OB + DHA, OB Complete Petite, OB Complete Premier, OB Complete with DHA, Obtrex DHA, One-A-Day Women's, One-A-Day Women's Prenatal, Paire OB Tablet Plus DHA, PNV OB + DHA, PNV Prenatal, PNV Prenatal Plus Multivitamin, PNV-DHA, PNV-DHA Plus, PNV-First, PNV-Iron, PNV-OB with DHA, PNV-Select, PNV: Ferrous Fumerate/Docusate/Folic Acid, PR Natal 400, PR [place] 400ec, PR [address] 430, PR [place] 430ec, PR [place] 440ec, PreCare, PreferaOB, PreferaOB + DHA, Premesis Rx, Prena1 Plus, Prena1 True, Prenaissance 90 DHA, Prenaissance Balance, Prenaissance DHA, Prenaissance Harmony DHA, Prenaissance Plus, Prenaissance Promise, PrenaPlus, Prenatabs OBN, Prenatabs RX, Prenatal 19, Prenatal AD, Prenatal Low Iron, Prenatal Multivitamin + DHA 2, Prenatal Plus, Prenatal Plus Iron, Prenatal Plus Low Iron, Prenatal Vitamin, PreNatal Vitamins Plus, Prenate Advance, Prenate DHA, Prenate Elite, Prenate Mini, PreNate Plus, Prenate Restore with Quatrefolic, PrePLUS, Previte Rx, PruEt DHA, PruEt DHAec, PureVit DualFe Plus, RE OB + DHA, RE OB 90 + DHA, RE PreVit + DHA, RE-Nata 29, RE-Nata 29 OB, Reaphrim, Renate, Renate DHA, Renate DHA Extra, Rovin-Nv, Rovin-Nv DHA, Se-Care, Se-Care Conceive, Se-Care Gesture, Se-Natal 19, Se-Natal 90, Se-Natal ONE, Se-Plete DHA, Select-OB + DHA, SetonET, SetonET-EC DHA, StrongStart, Stuart Prenatal + DHA, Taron A Prenatal Pack with DHA, Taron EC Calcium DHA Pack, Taron Prenatal with DHA, Taron-EC Cal, Taron-Prex Prenatal with DHA, Thera [employer], Thera-Tabs, Thrivite,  TL-Care DHA, Tri Rx,  Trifera OB, Trimesis Rx, TriStart DHA, Triveen-Duo DHA, Trust [employer], Vena-Bal DHA, Verotin-GR, Vinate C, Vinate Care, Vinate II, Vinate III, Virt-Nate, Virt-PN, Virt-PN DHA, vita True, Vitafol PN, Vitafol Ultra, Vitafol-Nano Prenatal, Vitafol-OB + DHA, Vitafol-OB and DHA, Vitafol-One, VitaMed MD Plus Rx, VitaNatal OB Plus DHA, VitaPhil + DHA, VitaPhil + DHA 90, Vol-Plus, Vol-Tab Rx, VP CH Ultra, VP-CH Plus, VP-CH-PNV, VP-HEME OB+ DHA, VP-PNV-DHA, Zatean-CH, Zatean-Pn, Zatean-Pn DHA What should I tell my health care provider before I take this medicine? They need to know if you have any of these conditions:  bleeding or clotting disorder  history of anemia of any type  other chronic health condition  an unusual or allergic reaction to vitamins, minerals, other medicines, foods, dyes, or preservatives How should I use this medicine? Take this medicine by mouth with a glass of water. You can take it with or without food. If it upsets your stomach, take it with food. Chewable prenatal vitamin tablets may be chewed completely before swallowing. Follow the directions on the prescription label. The usual dose is taken once a day. Do not take your medicine more often than directed. Contact your pediatrician regarding the use of this medicine in children. Special care may be needed. This medicine is intended for females who are pregnant, breast-feeding, or may become pregnant. Overdosage: If you think you have taken too much of this medicine contact a poison control center or emergency room at once. NOTE: This medicine is only for you. Do not share this medicine with others. What if I miss a dose? If you miss a dose, take it as soon as you can. If it is almost time for your next dose, take only that dose. Do not take double or extra doses. What may interact with this medicine?  alendronate  antacids  cefdinir  cefditoren  etidronate  fluoroquinolone antibiotics (examples: ciprofloxacin,  gatifloxacin, levofloxacin)  ibandronate  levodopa  risedronate  tetracycline antibiotics (examples: doxycycline, minocycline, tetracycline)  thyroid hormones  warfarin This list may not describe all possible interactions. Give your health care provider a list of all the medicines, herbs, non-prescription drugs, or dietary supplements you use. Also tell them if you smoke, drink alcohol, or use illegal drugs. Some items may interact with your medicine. What should I watch for while using this medicine? See your health care professional for regular checks on your progress. Remember that vitamin and mineral supplements do not replace the need for good nutrition from a balanced diet. Stools commonly change color when vitamins and minerals are taken. Notify your health care professional if this change is alarming or accompanied by other symptoms, like abdominal pain. What side effects may I notice from receiving this medicine? Side effects that you should report to your doctor or health care professional as soon as possible:  allergic reaction such as skin rash or difficulty breathing  vomiting Side effects that usually do not require medical attention (report to your doctor or health care professional if they continue or are bothersome):  nausea  stomach upset This list may not describe all possible side effects. Call your doctor for medical advice about side effects. You may report side effects to FDA at 1-800-FDA-1088. Where should I keep my medicine? Keep out of the reach of children. Most vitamins and minerals should be stored at controlled room temperature. Check your specific product directions. Protect from heat and moisture. Throw away any unused medicine after the expiration date. NOTE: This sheet is  a summary. It may not cover all possible information. If you have questions about this medicine, talk to your doctor, pharmacist, or health care provider.  2021 Elsevier/Gold  Standard (2017-04-06 09:17:32)

## 2020-06-11 NOTE — Telephone Encounter (Signed)
Patient calls nurse line requesting refill on inhaler. Patient was seen in office today, however, forgot to request when she was with provider.   Pended to this encounter.   Please advise.   Veronda Prude, RN

## 2020-06-13 ENCOUNTER — Other Ambulatory Visit: Payer: Self-pay | Admitting: Student in an Organized Health Care Education/Training Program

## 2020-06-13 ENCOUNTER — Telehealth: Payer: Self-pay

## 2020-06-13 DIAGNOSIS — Z3169 Encounter for other general counseling and advice on procreation: Secondary | ICD-10-CM | POA: Insufficient documentation

## 2020-06-13 DIAGNOSIS — Z113 Encounter for screening for infections with a predominantly sexual mode of transmission: Secondary | ICD-10-CM | POA: Insufficient documentation

## 2020-06-13 LAB — CERVICOVAGINAL ANCILLARY ONLY
Bacterial Vaginitis (gardnerella): POSITIVE — AB
Candida Glabrata: NEGATIVE
Candida Vaginitis: NEGATIVE
Chlamydia: NEGATIVE
Comment: NEGATIVE
Comment: NEGATIVE
Comment: NEGATIVE
Comment: NEGATIVE
Comment: NEGATIVE
Comment: NORMAL
Neisseria Gonorrhea: NEGATIVE
Trichomonas: NEGATIVE

## 2020-06-13 MED ORDER — METRONIDAZOLE 500 MG PO TABS: 500.0000 mg | ORAL_TABLET | Freq: Two times a day (BID) | ORAL | 0 refills | Status: AC

## 2020-06-13 NOTE — Assessment & Plan Note (Signed)
Pre-conception counseling including education on indication for prenatal vitamins and her very high risk of becoming pregnant with current lifestyle. She feels she is ready for parenthood

## 2020-06-13 NOTE — Assessment & Plan Note (Signed)
Patient denies symptoms, known contact or h/o STI but requests testing today Pelvic exam positive for mild clear-grey mucoid discharge and scant blood from os but otherwise normal Sent sample for GC/chlam/trich/BV/yeast Patient declined HIV/RPR

## 2020-06-13 NOTE — Progress Notes (Signed)
Sent in metronidazole tx for BV

## 2020-06-13 NOTE — Telephone Encounter (Signed)
Patient calls nurse line reporting she saw her positive BV result on mychart. Patient requesting Flagyl sent to her preferred pharmacy.

## 2020-06-21 ENCOUNTER — Ambulatory Visit: Payer: Self-pay | Admitting: Family Medicine

## 2020-07-04 ENCOUNTER — Ambulatory Visit (HOSPITAL_COMMUNITY)
Admission: EM | Admit: 2020-07-04 | Discharge: 2020-07-04 | Disposition: A | Discharge status: Home / Self Care | Payer: Self-pay | Attending: Internal Medicine | Admitting: Internal Medicine

## 2020-07-04 ENCOUNTER — Encounter (HOSPITAL_COMMUNITY): Payer: Self-pay

## 2020-07-04 ENCOUNTER — Other Ambulatory Visit: Payer: Self-pay

## 2020-07-04 ENCOUNTER — Ambulatory Visit: Payer: Self-pay

## 2020-07-04 DIAGNOSIS — J101 Influenza due to other identified influenza virus with other respiratory manifestations: Secondary | ICD-10-CM

## 2020-07-04 LAB — POC INFLUENZA A AND B ANTIGEN (URGENT CARE ONLY)
INFLUENZA A ANTIGEN, POC: POSITIVE — AB
INFLUENZA B ANTIGEN, POC: NEGATIVE

## 2020-07-04 LAB — POCT RAPID STREP A, ED / UC: Streptococcus, Group A Screen (Direct): NEGATIVE

## 2020-07-04 MED ORDER — IBUPROFEN 600 MG PO TABS: 600.0000 mg | ORAL_TABLET | Freq: Four times a day (QID) | ORAL | 0 refills | Status: DC | PRN

## 2020-07-04 MED ORDER — OSELTAMIVIR PHOSPHATE 75 MG PO CAPS: 75.0000 mg | ORAL_CAPSULE | Freq: Two times a day (BID) | ORAL | 0 refills | Status: DC

## 2020-07-04 NOTE — Discharge Instructions (Addendum)
Increase oral fluid intake Take medications as prescribed We will call you with recommendations if labs are abnormal Return to urgent care if symptoms worsen. 

## 2020-07-04 NOTE — ED Notes (Signed)
Per Dr. Leonides Grills Covid swab not sent to Lab due to Positive Flu A results. Also, no throat culture was sent due to positive Flu A per Dr. Leonides Grills
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
## 2020-07-04 NOTE — ED Triage Notes (Signed)
Pt c/o sore throat, abdominal pain and shoulder pain X 1 day.

## 2020-08-19 ENCOUNTER — Other Ambulatory Visit: Payer: Self-pay

## 2020-09-05 NOTE — Patient Instructions (Addendum)
It was wonderful to meet you today.  Today we talked about:  Believe that you are having a flareup from your allergies.  You should take Zyrtec daily.  You should also take your Flonase daily.  If you want to have lung function testing done, schedule an appointment with Dr. Raymondo Band. He will accept patients for this starting August 1st.   I am concerned about your weight trend. I would like you to schedule an appointment for follow up so that we can discuss this further. This is likely contributing to your acid reflux. We talked about methods to help your reflux including avoiding eating late at night, avoiding acidic foods, avoiding alcohol, weight loss.  I am also prescribing you Pepcid to use as needed for reflux that you should work on the methods above to help long-term.  I recommend you take prenatal vitamins as you are at high risk of becoming pregnant having unprotected intercourse. If you want to discuss birth control further I am happy to do so with you.    Thank you for choosing [facility].   Please call [phone] with any questions about today's appointment.  Please be sure to schedule follow up at the front  desk before you leave today.   Sabino Dick, DO PGY-2 Family Medicine

## 2020-09-05 NOTE — Progress Notes (Signed)
SUBJECTIVE:   CHIEF COMPLAINT / HPI:   Medication Refills Patient presents today for medication refills.  Would like medication refills on albuterol and omeprazole.  She typically has to use albuterol during this time year, is unsure if this is asthma or allergy related.  She takes Zyrtec as needed and Flonase as needed. She also has sneezing, runny nose, itchy eyes. Has never had pulmonary function testing done. She states that she has reflux. Takes Omeprazole as needed. Does not eat late. Sometimes has chocolate milk. Denies alcohol consumption.   Period concern Patient has noted some clotting with her recent period. Period began on Sunday 7/3 and is still ongoing.  She is using the same amount of pads/tampons she normally uses.  Is not having any pelvic pain.  She states it was 3-days late.  She is currently sexually active with her boyfriend, is not using any contraceptives.  She is not taking prenatal vitamins.  They are not actively trying to conceive but would be okay if it happened.  She is wondering if she can have a Pap smear done to test for fertility.   PERTINENT  PMH / PSH:  Past Medical History:  Diagnosis Date   Adjustment disorder with depressed mood    Allergic rhinitis    Pneumonia    Strep pharyngitis     OBJECTIVE:   BP 118/76   Pulse (!) 106   Ht 5' (1.524 m)   Wt 194 lb 6.4 oz (88.2 kg)   LMP 09/01/2020 (Exact Date)   SpO2 97%   BMI 37.97 kg/m    General: NAD, pleasant, able to participate in exam Respiratory: Breathing comfortably on room air Abdomen: Obese abdomen Psych: Normal affect and mood  ASSESSMENT/PLAN:   Allergies Feel this is likely isolated allergies and not asthma given her associated symptoms and seasonal.   Patient would benefit from PFTs to confirm. Will hold off on Albuterol refill at this time. Recommended daily use of Zyrtec and Flonase for best results. Educated on how to use Flonase.   Unprotected sexual intercourse Discussed  on importance of prenatal vitamins as she is at high risk for becoming pregnant.  Discussed that I would be happy to discuss birth control options with her should she decide to start on one. Offered condoms in clinic, patient declined. Urine pregnancy test today negative. Blood clots with period are likely normal finding at this time given no other associated symptoms, will monitor. Discussed that Pap smears are for cervical cancer screening and not fertility testing.   Dyspepsia Previously on Omeprazole, given that patient is at risk for pregnancy will switch to Pepcid. Also discussed lifestyle modifications to help with reflux as well. Patient with significant weight gain- about 30 lbs since last visit in April and about 55 lbs since February of this year. This is concerning and likely contributing.  Weight gain As stated above, patient with approximately 30 pound weight gain since April and 55 pound weight gain since February of this year.  Able to only briefly discuss this, she is scheduled for follow-up for further discussion and evaluation with the labs with Dr. Salvadore Dom (unfortunately, had no availability in the next 2 months that that patient schedule).  Would like to discuss diet and exercise further.  Also would like to discuss possible psychiatric condition contributing such as depression. Would like to check TSH, cholesterol, Hgb A1c. Differential for her obesity includes poor diet/exercise, hypothyroidism. Less concerned for cushings syndrome. Could consider growth hormone  deficiency as patient is also short stature with primarily abdominal adiposity.       Sabino Dick, DO [facility]

## 2020-09-06 ENCOUNTER — Ambulatory Visit (INDEPENDENT_AMBULATORY_CARE_PROVIDER_SITE_OTHER): Payer: Self-pay | Admitting: Family Medicine

## 2020-09-06 ENCOUNTER — Other Ambulatory Visit: Payer: Self-pay

## 2020-09-06 ENCOUNTER — Encounter: Payer: Self-pay | Admitting: Family Medicine

## 2020-09-06 VITALS — BP 118/76 | HR 106 | Ht 60.0 in | Wt 194.4 lb

## 2020-09-06 DIAGNOSIS — Z7251 High risk heterosexual behavior: Secondary | ICD-10-CM | POA: Insufficient documentation

## 2020-09-06 DIAGNOSIS — R635 Abnormal weight gain: Secondary | ICD-10-CM | POA: Insufficient documentation

## 2020-09-06 DIAGNOSIS — T7840XA Allergy, unspecified, initial encounter: Secondary | ICD-10-CM | POA: Insufficient documentation

## 2020-09-06 DIAGNOSIS — R1013 Epigastric pain: Secondary | ICD-10-CM | POA: Insufficient documentation

## 2020-09-06 LAB — POCT URINE PREGNANCY: Preg Test, Ur: NEGATIVE

## 2020-09-06 MED ORDER — FAMOTIDINE 10 MG PO TABS: 10.0000 mg | ORAL_TABLET | Freq: Two times a day (BID) | ORAL | 1 refills | Status: DC

## 2020-09-06 NOTE — Assessment & Plan Note (Addendum)
Feel this is likely isolated allergies and not asthma given her associated symptoms and seasonal.   Patient would benefit from PFTs to confirm. Will hold off on Albuterol refill at this time. Recommended daily use of Zyrtec and Flonase for best results. Educated on how to use Flonase.

## 2020-09-06 NOTE — Assessment & Plan Note (Addendum)
Previously on Omeprazole, given that patient is at risk for pregnancy will switch to Pepcid. Also discussed lifestyle modifications to help with reflux as well. Patient with significant weight gain- about 30 lbs since last visit in April and about 55 lbs since February of this year. This is concerning and likely contributing.

## 2020-09-06 NOTE — Assessment & Plan Note (Addendum)
Discussed on importance of prenatal vitamins as she is at high risk for becoming pregnant.  Discussed that I would be happy to discuss birth control options with her should she decide to start on one. Offered condoms in clinic, patient declined. Urine pregnancy test today negative. Blood clots with period are likely normal finding at this time given no other associated symptoms, will monitor. Discussed that Pap smears are for cervical cancer screening and not fertility testing.

## 2020-09-06 NOTE — Assessment & Plan Note (Signed)
As stated above, patient with approximately 30 pound weight gain since April and 55 pound weight gain since February of this year.  Able to only briefly discuss this, she is scheduled for follow-up for further discussion and evaluation with the labs with Dr. Salvadore Dom (unfortunately, had no availability in the next 2 months that that patient schedule).  Would like to discuss diet and exercise further.  Also would like to discuss possible psychiatric condition contributing such as depression. Would like to check TSH, cholesterol, Hgb A1c. Differential for her obesity includes poor diet/exercise, hypothyroidism. Less concerned for cushings syndrome. Could consider growth hormone deficiency as patient is also short stature with primarily abdominal adiposity.

## 2020-09-27 ENCOUNTER — Ambulatory Visit: Payer: Self-pay | Admitting: Family Medicine

## 2020-09-27 NOTE — Progress Notes (Deleted)
    SUBJECTIVE:   CHIEF COMPLAINT / HPI:   ***  PERTINENT  PMH / PSH: ***  OBJECTIVE:   LMP 09/01/2020 (Exact Date)   ***  ASSESSMENT/PLAN:   No problem-specific Assessment & Plan notes found for this encounter.     Lavonda Jumbo, DO [facility]

## 2021-01-09 ENCOUNTER — Ambulatory Visit (HOSPITAL_COMMUNITY): Payer: Self-pay

## 2021-01-09 ENCOUNTER — Encounter (HOSPITAL_COMMUNITY): Payer: Self-pay | Admitting: Emergency Medicine

## 2021-01-09 ENCOUNTER — Ambulatory Visit (HOSPITAL_COMMUNITY)
Admission: EM | Admit: 2021-01-09 | Discharge: 2021-01-09 | Disposition: A | Discharge status: Home / Self Care | Payer: Self-pay | Attending: Physician Assistant | Admitting: Physician Assistant

## 2021-01-09 ENCOUNTER — Other Ambulatory Visit: Payer: Self-pay

## 2021-01-09 DIAGNOSIS — J069 Acute upper respiratory infection, unspecified: Secondary | ICD-10-CM

## 2021-01-09 DIAGNOSIS — J309 Allergic rhinitis, unspecified: Secondary | ICD-10-CM | POA: Insufficient documentation

## 2021-01-09 DIAGNOSIS — R051 Acute cough: Secondary | ICD-10-CM | POA: Insufficient documentation

## 2021-01-09 DIAGNOSIS — Z28310 Unvaccinated for covid-19: Secondary | ICD-10-CM | POA: Insufficient documentation

## 2021-01-09 DIAGNOSIS — F17290 Nicotine dependence, other tobacco product, uncomplicated: Secondary | ICD-10-CM | POA: Insufficient documentation

## 2021-01-09 DIAGNOSIS — Z20822 Contact with and (suspected) exposure to covid-19: Secondary | ICD-10-CM | POA: Insufficient documentation

## 2021-01-09 LAB — POC INFLUENZA A AND B ANTIGEN (URGENT CARE ONLY)
INFLUENZA A ANTIGEN, POC: NEGATIVE
INFLUENZA B ANTIGEN, POC: NEGATIVE

## 2021-01-09 LAB — POCT RAPID STREP A, ED / UC: Streptococcus, Group A Screen (Direct): NEGATIVE

## 2021-01-09 MED ORDER — OSELTAMIVIR PHOSPHATE 75 MG PO CAPS: 75.0000 mg | ORAL_CAPSULE | Freq: Two times a day (BID) | ORAL | 0 refills | Status: DC

## 2021-01-09 MED ORDER — PROMETHAZINE-DM 6.25-15 MG/5ML PO SYRP: 5.0000 mL | ORAL_SOLUTION | Freq: Four times a day (QID) | ORAL | 0 refills | Status: DC | PRN
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
## 2021-01-09 NOTE — ED Triage Notes (Signed)
Pt is present today with HA, cough, sore throat, body aches, and vomiting. Pt sx started x2 days ago.

## 2021-01-09 NOTE — Discharge Instructions (Signed)
Your flu and strep test came back negative.  We will contact you if COVID is positive.  Please remain in isolation until you get your results.  We will start Tamiflu because I suspect that you have the flu.  Use promethazine DM for cough.  This make you sleepy do not drive or drink alcohol with it.  Use Mucinex and Flonase for symptom relief.  Ensure drinking plenty of fluid.  If you have any worsening symptoms please return for reevaluation.

## 2021-01-10 LAB — SARS CORONAVIRUS 2 (TAT 6-24 HRS): SARS Coronavirus 2: NEGATIVE

## 2021-01-12 LAB — CULTURE, GROUP A STREP (THRC)

## 2021-03-06 ENCOUNTER — Ambulatory Visit: Payer: Self-pay | Admitting: Student

## 2021-03-06 NOTE — Progress Notes (Deleted)
° ° °  SUBJECTIVE:   CHIEF COMPLAINT / HPI:   Late period, wanting preg test.  PERTINENT  PMH / PSH: ***  OBJECTIVE:   There were no vitals taken for this visit.  ***  ASSESSMENT/PLAN:   No problem-specific Assessment & Plan notes found for this encounter.     Holley Bouche, MD [place]

## 2021-03-14 ENCOUNTER — Ambulatory Visit: Payer: Self-pay | Admitting: Family Medicine

## 2021-03-27 NOTE — Progress Notes (Deleted)
° ° °  SUBJECTIVE:   CHIEF COMPLAINT / HPI:   Infertility Concerns Alyssa Chang is a 21 y.o. female who presents to the [facility] clinic to discuss concerns regarding infertility. Menstrual cycles  started at age *** and are ***. She is *** on contraception. Has *** partner who does not have children from other relationships***. Denies previous history of gynecological surgery or history of PID, fibroids, endometriosis. Denies changes in hair growth, body weight or breast discharge. Medications include ***. No history of chemotherapy or radiation. No illicit substance use, tobacco or alcohol use. Frequency of intercourse ***. No previous infertility testing and therapies. No Fhx of birth defects, intellectual disability or reproductive failure. No pelvic, abdominal pain or symptoms of thyroid disease (hot/cold intolerance, weight loss, weight gain, tremors, fatigue. Diarrhea/constipation).   Health Maintenance Due for first Pap smear today. Amenable to STI testing as well. ***   PERTINENT  PMH / PSH:  Past Medical History:  Diagnosis Date   Adjustment disorder with depressed mood    Allergic rhinitis    Pneumonia    Strep pharyngitis       OBJECTIVE:   There were no vitals taken for this visit. ***  General: NAD, pleasant, able to participate in exam Cardiac: RRR, no murmurs. Respiratory: CTAB, normal effort, No wheezes, rales or rhonchi Abdomen: Bowel sounds present, nontender, nondistended, no hepatosplenomegaly. GU: Normal appearance of labia majora and minora, without lesions. Vagina tissue pink, moist, without lesions or abrasions. Cervix normal appearance, non-friable, without discharge from os.  Extremities: no edema or cyanosis. Skin: warm and dry, no rashes noted Neuro: alert, no obvious focal deficits Psych: Normal affect and mood  GU exam chaperoned by *** CMA  ASSESSMENT/PLAN:   No problem-specific Assessment & Plan notes found for this encounter.    21 y.o. female  concerned about possible infertility. Risk factors include ***. Discussed that further evaluation for infertility is typically offered in women under 36 years of age who have been unable to conceive after 12 months of unprotected and frequent intercourse. Earlier evaluation (after 6 months) can be done in those at higher risk (67 years or older, history of oligo/amenorrhea, tubal disease or endometriosis, or if known female factor for infertility). Diagnostic tests for infertility evaluation include semen analysis, can do over the counter urinary ovulation prediction kit- which detect LH and are highly effective for predicting timing of the [facility] surge. Can do hysterosalpingogram to determine tubal patency and other structural abnormalities. Can further refer to OBGYN for additional workup.    Sharion Settler, [place]

## 2021-04-04 ENCOUNTER — Ambulatory Visit: Payer: Self-pay | Admitting: Family Medicine

## 2021-04-24 ENCOUNTER — Encounter: Payer: Self-pay | Admitting: Family Medicine

## 2021-04-24 NOTE — Progress Notes (Signed)
Patient has no-showed to multiple appointments in a 64-month period. Per our no-show policy, a letter has been routed to [facility] Admin to be sent via certified mail.

## 2021-06-05 IMAGING — CR DG KNEE COMPLETE 4+V*L*
4 series · 4 of 4 positions shown · non-contrast
Comparison: 10/31/2009

CLINICAL DATA: Knee pain, fall

EXAM:
LEFT KNEE - COMPLETE 4+ VIEW

[knee ap]
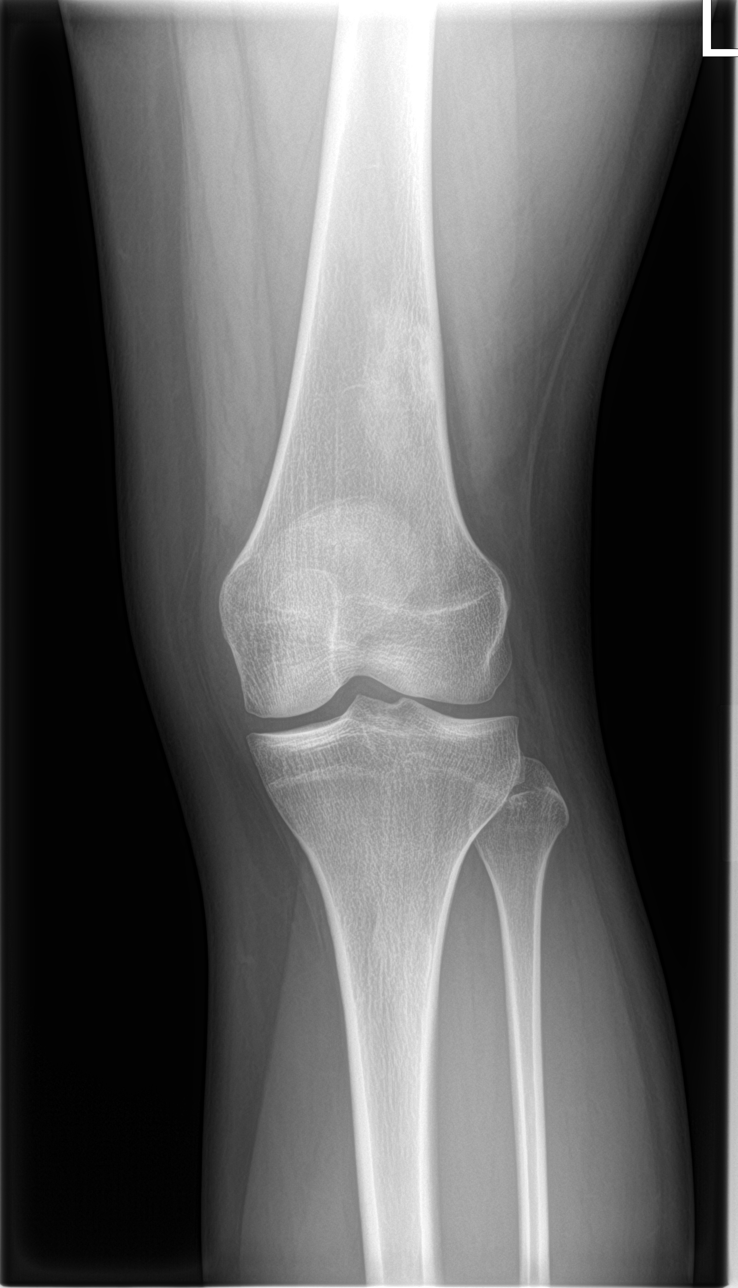

[knee lat]
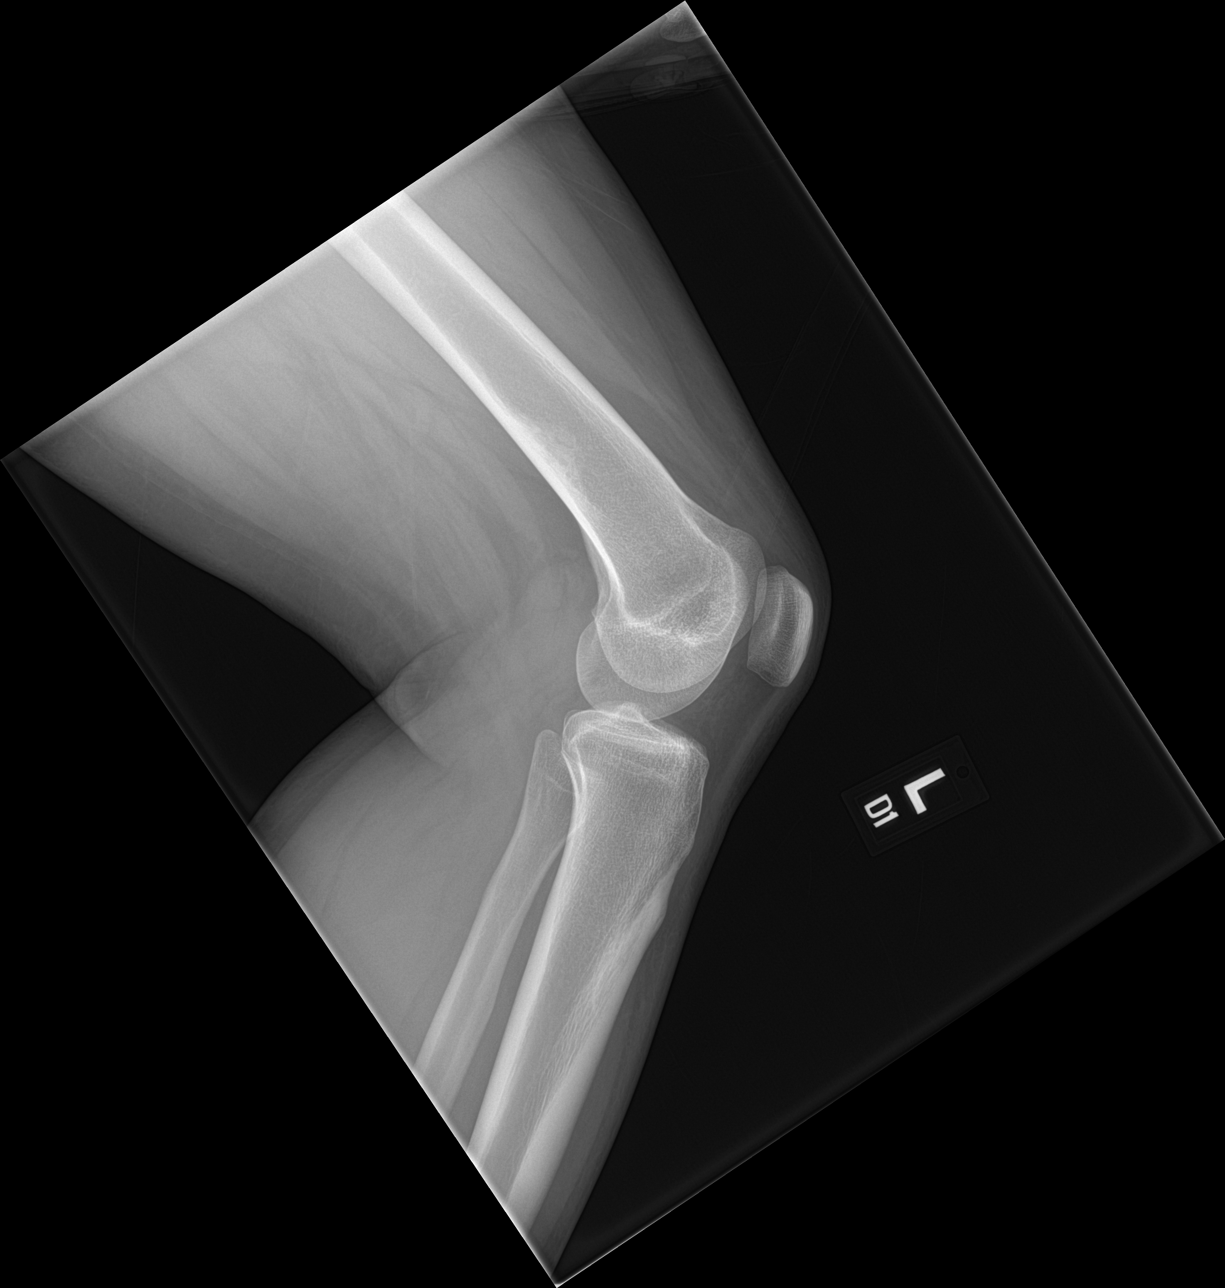

[knee obl (1 of 2)]
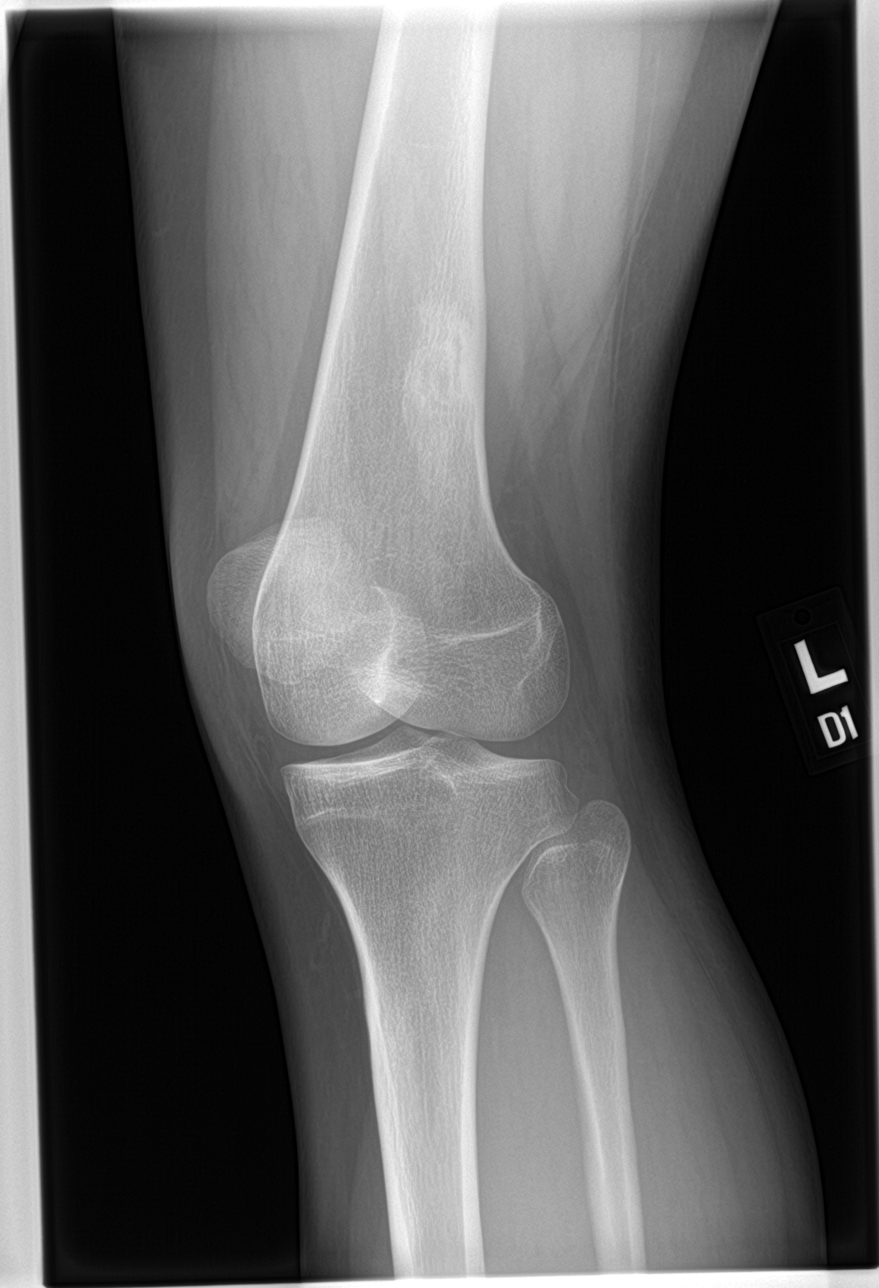

[knee obl (2 of 2)]
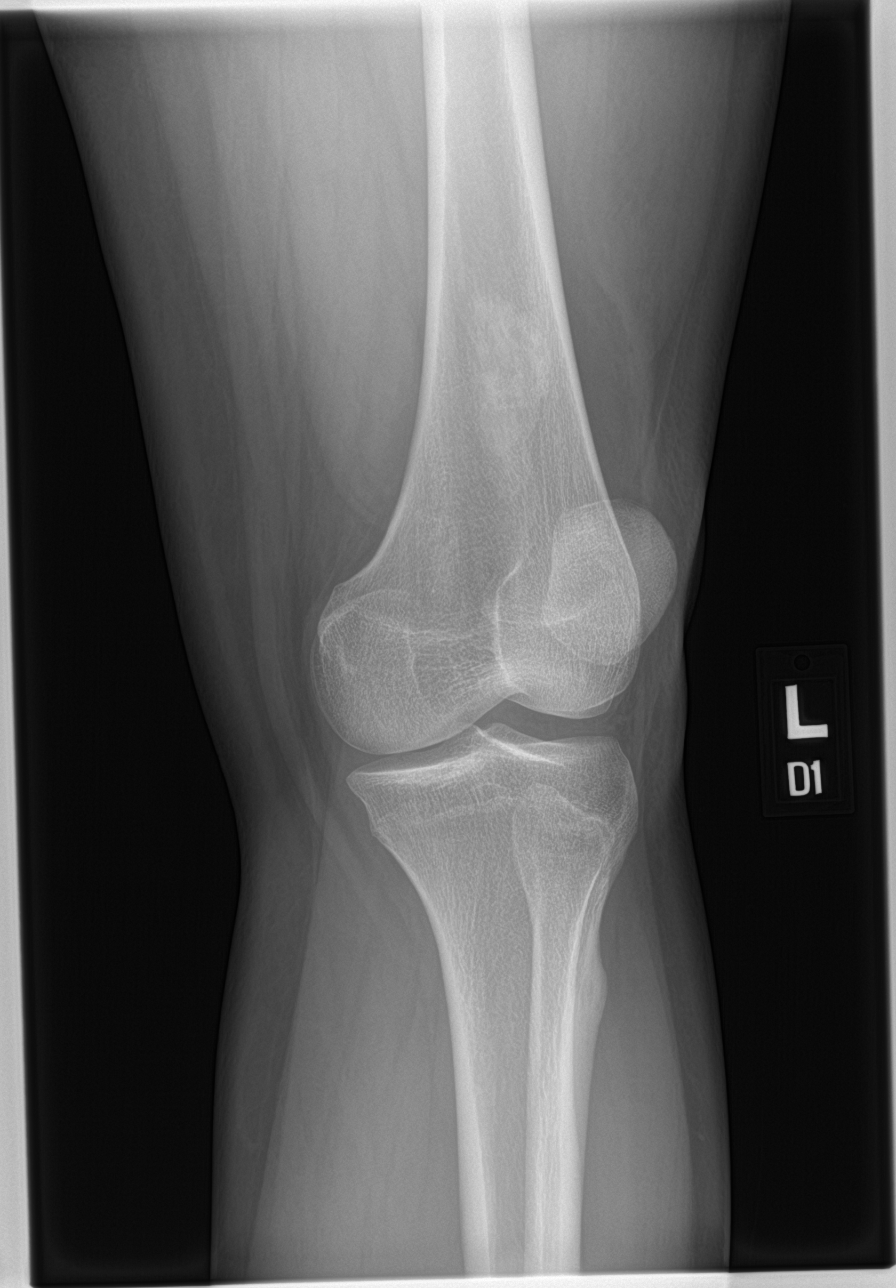

[4 of 4 positions shown; findings below may reference images not displayed]

FINDINGS: No fracture or malalignment. Joint spaces are maintained. No
significant knee effusion. Mild sclerosis in the distal femoral
metaphysis, likely corresponding to healing nonossifying fibroma.
IMPRESSION: No acute osseous abnormality

## 2021-08-05 ENCOUNTER — Encounter: Payer: Self-pay | Admitting: *Deleted

## 2021-08-18 ENCOUNTER — Ambulatory Visit
Admission: EM | Admit: 2021-08-18 | Discharge: 2021-08-18 | Disposition: A | Discharge status: Home / Self Care | Payer: Self-pay | Attending: Internal Medicine | Admitting: Internal Medicine

## 2021-08-18 DIAGNOSIS — J029 Acute pharyngitis, unspecified: Secondary | ICD-10-CM

## 2021-08-18 LAB — POCT RAPID STREP A (OFFICE): Rapid Strep A Screen: NEGATIVE
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
## 2021-08-18 NOTE — Discharge Instructions (Addendum)
Salt water gargle Chloraseptic throat spray Maintain adequate hydration We will call you with recommendations if cultures are abnormal.

## 2021-08-18 NOTE — ED Triage Notes (Signed)
Pt presents with sore throat since yesterday.  

## 2021-08-22 LAB — CULTURE, GROUP A STREP (THRC)

## 2021-08-27 ENCOUNTER — Ambulatory Visit
Admission: EM | Admit: 2021-08-27 | Discharge: 2021-08-27 | Disposition: A | Discharge status: Home / Self Care | Payer: Self-pay | Attending: Family Medicine | Admitting: Family Medicine

## 2021-08-27 DIAGNOSIS — H60392 Other infective otitis externa, left ear: Secondary | ICD-10-CM

## 2021-08-27 MED ORDER — IBUPROFEN 800 MG PO TABS: 800.0000 mg | ORAL_TABLET | Freq: Three times a day (TID) | ORAL | 0 refills | Status: DC | PRN

## 2021-08-27 MED ORDER — DOXYCYCLINE HYCLATE 100 MG PO CAPS: 100.0000 mg | ORAL_CAPSULE | Freq: Two times a day (BID) | ORAL | 0 refills | Status: AC

## 2021-08-27 MED ORDER — NEOMYCIN-POLYMYXIN-HC 3.5-10000-1 OT SOLN: 4.0000 [drp] | Freq: Four times a day (QID) | OTIC | 0 refills | Status: AC

## 2021-08-27 NOTE — ED Triage Notes (Signed)
Pt presents with left ear pain X 3 days.  

## 2021-08-27 NOTE — Discharge Instructions (Addendum)
You have an external otitis or swimmer's ear type infection  Cortisporin eardrops in your left ear 4 times a day for 7 days  Take doxycycline 100 mg--1 capsule 2 times daily for 7 days  Take ibuprofen 800 mg--1 tab every 8 hours as needed for pain.
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
## 2021-10-28 ENCOUNTER — Encounter: Payer: Self-pay | Admitting: Emergency Medicine

## 2021-10-28 ENCOUNTER — Ambulatory Visit
Admission: EM | Admit: 2021-10-28 | Discharge: 2021-10-28 | Disposition: A | Discharge status: Home / Self Care | Payer: Self-pay | Attending: Physician Assistant | Admitting: Physician Assistant

## 2021-10-28 ENCOUNTER — Other Ambulatory Visit: Payer: Self-pay

## 2021-10-28 DIAGNOSIS — M25562 Pain in left knee: Secondary | ICD-10-CM

## 2021-10-28 DIAGNOSIS — G8929 Other chronic pain: Secondary | ICD-10-CM

## 2021-10-28 LAB — POCT URINE PREGNANCY: Preg Test, Ur: NEGATIVE

## 2021-10-28 MED ORDER — PREDNISONE 20 MG PO TABS: 40.0000 mg | ORAL_TABLET | Freq: Every day | ORAL | 0 refills | Status: AC

## 2021-10-28 NOTE — Discharge Instructions (Addendum)
  Please follow up with ortho as soon as possible for further evaluation.

## 2021-10-28 NOTE — ED Triage Notes (Signed)
Pt here for left knee pain x 1 year after injuring while skating
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
## 2021-12-25 ENCOUNTER — Encounter: Payer: Self-pay | Admitting: Emergency Medicine

## 2021-12-25 ENCOUNTER — Ambulatory Visit
Admission: EM | Admit: 2021-12-25 | Discharge: 2021-12-25 | Disposition: A | Discharge status: Home / Self Care | Payer: Self-pay | Attending: Physician Assistant | Admitting: Physician Assistant

## 2021-12-25 DIAGNOSIS — R051 Acute cough: Secondary | ICD-10-CM

## 2021-12-25 DIAGNOSIS — J209 Acute bronchitis, unspecified: Secondary | ICD-10-CM

## 2021-12-25 MED ORDER — DM-GUAIFENESIN ER 30-600 MG PO TB12: 1.0000 | ORAL_TABLET | Freq: Two times a day (BID) | ORAL | 0 refills | Status: DC

## 2021-12-25 MED ORDER — FLUTICASONE PROPIONATE 50 MCG/ACT NA SUSP: 2.0000 | Freq: Every day | NASAL | 1 refills | Status: DC

## 2021-12-25 NOTE — ED Triage Notes (Signed)
Pt is present today with cough, nasal congestion, and sore throat. Pt sx started x2 days ago

## 2021-12-25 NOTE — Discharge Instructions (Addendum)
Advised to take Mucinex DM every 12 hours to help control the cough and chest congestion. Advised take ibuprofen every 8 hours as needed for fever aches or pains. Follow-up with PCP or return to urgent care if symptoms fail to improve.
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
## 2022-01-26 ENCOUNTER — Ambulatory Visit (HOSPITAL_COMMUNITY)
Admission: EM | Admit: 2022-01-26 | Discharge: 2022-01-26 | Disposition: A | Discharge status: Home / Self Care | Payer: Self-pay | Attending: Family Medicine | Admitting: Family Medicine

## 2022-01-26 ENCOUNTER — Encounter (HOSPITAL_COMMUNITY): Payer: Self-pay

## 2022-01-26 DIAGNOSIS — R1013 Epigastric pain: Secondary | ICD-10-CM

## 2022-01-26 DIAGNOSIS — N910 Primary amenorrhea: Secondary | ICD-10-CM

## 2022-01-26 DIAGNOSIS — Z113 Encounter for screening for infections with a predominantly sexual mode of transmission: Secondary | ICD-10-CM

## 2022-01-26 LAB — POC URINE PREG, ED: Preg Test, Ur: NEGATIVE

## 2022-01-26 LAB — HIV ANTIBODY (ROUTINE TESTING W REFLEX): HIV Screen 4th Generation wRfx: NONREACTIVE

## 2022-01-26 MED ORDER — FAMOTIDINE 20 MG PO TABS: 20.0000 mg | ORAL_TABLET | Freq: Two times a day (BID) | ORAL | 0 refills | Status: DC

## 2022-01-26 NOTE — Discharge Instructions (Signed)
The pregnancy test is negative  Take famotidine 20 mg--1 tablet 2 times daily.  This is for stomach acid and acid reflux  Staff will notify you of any positives on the swab or the blood test.

## 2022-01-26 NOTE — ED Triage Notes (Signed)
Pt is here for abdominal pain and STD, elevated heart rate
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
## 2022-01-27 ENCOUNTER — Telehealth (HOSPITAL_COMMUNITY): Payer: Self-pay | Admitting: Emergency Medicine

## 2022-01-27 LAB — CERVICOVAGINAL ANCILLARY ONLY
Bacterial Vaginitis (gardnerella): POSITIVE — AB
Candida Glabrata: NEGATIVE
Candida Vaginitis: NEGATIVE
Chlamydia: NEGATIVE
Comment: NEGATIVE
Comment: NEGATIVE
Comment: NEGATIVE
Comment: NEGATIVE
Comment: NEGATIVE
Comment: NORMAL
Neisseria Gonorrhea: POSITIVE — AB
Trichomonas: NEGATIVE

## 2022-01-27 LAB — RPR: RPR Ser Ql: NONREACTIVE

## 2022-01-27 MED ORDER — METRONIDAZOLE 500 MG PO TABS: 500.0000 mg | ORAL_TABLET | Freq: Two times a day (BID) | ORAL | 0 refills | Status: DC

## 2022-01-27 NOTE — Telephone Encounter (Signed)
Per protocol, patient will need treatment with IM Rocephin 500mg  for positive Gonorrhea.  Will also need treatment with Metronidazole.   Attempted to reach patient x 1, unable to LVM Prescription sent to pharmacy on file

## 2022-01-28 ENCOUNTER — Ambulatory Visit (HOSPITAL_COMMUNITY)
Admission: EM | Admit: 2022-01-28 | Discharge: 2022-01-28 | Disposition: A | Discharge status: Home / Self Care | Payer: Self-pay | Attending: Internal Medicine | Admitting: Internal Medicine

## 2022-01-28 DIAGNOSIS — A549 Gonococcal infection, unspecified: Secondary | ICD-10-CM

## 2022-01-28 MED ORDER — CEFTRIAXONE SODIUM 500 MG IJ SOLR
INTRAMUSCULAR | Status: AC
  Filled 2022-01-28: qty 500

## 2022-01-28 MED ORDER — LIDOCAINE HCL (PF) 1 % IJ SOLN
INTRAMUSCULAR | Status: AC
  Filled 2022-01-28: qty 2

## 2022-01-28 MED ORDER — CEFTRIAXONE SODIUM 1 G IJ SOLR
500.0000 mg | Freq: Once | INTRAMUSCULAR | Status: AC
  Administered 2022-01-28: 500 mg via INTRAMUSCULAR

## 2022-01-28 NOTE — ED Notes (Signed)
Patient presenting for Rocephin injection for treatment of positive gonorrhea result. Medication ordered to be given.

## 2022-02-21 ENCOUNTER — Emergency Department (HOSPITAL_COMMUNITY): Admission: EM | Admit: 2022-02-21 | Discharge: 2022-02-22 | Discharge status: Against Medical Advice | Payer: Self-pay

## 2022-02-21 NOTE — ED Notes (Signed)
Pt called X3 for triage. Pt could not be found.  

## 2022-02-22 NOTE — ED Notes (Signed)
Pt called X2 for triage. Pt could not be found.  

## 2022-09-26 ENCOUNTER — Ambulatory Visit (HOSPITAL_COMMUNITY)
Admission: RE | Admit: 2022-09-26 | Discharge: 2022-09-26 | Disposition: A | Discharge status: Home / Self Care | Payer: Medicaid Other | Source: Ambulatory Visit | Attending: Family Medicine | Admitting: Family Medicine

## 2022-09-26 ENCOUNTER — Encounter (HOSPITAL_COMMUNITY): Payer: Self-pay

## 2022-09-26 VITALS — BP 127/94 | HR 109 | Temp 98.5°F | Resp 16

## 2022-09-26 DIAGNOSIS — N939 Abnormal uterine and vaginal bleeding, unspecified: Secondary | ICD-10-CM | POA: Diagnosis not present

## 2022-09-26 DIAGNOSIS — R21 Rash and other nonspecific skin eruption: Secondary | ICD-10-CM

## 2022-09-26 HISTORY — DX: Gastro-esophageal reflux disease without esophagitis: K21.9

## 2022-09-26 LAB — POCT URINE PREGNANCY: Preg Test, Ur: NEGATIVE

## 2022-09-26 LAB — POCT URINALYSIS DIP (MANUAL ENTRY)
Glucose, UA: NEGATIVE mg/dL
Ketones, POC UA: NEGATIVE mg/dL
Leukocytes, UA: NEGATIVE
Nitrite, UA: NEGATIVE
Protein Ur, POC: NEGATIVE mg/dL
Spec Grav, UA: >=1.03 — AB (ref 1.010–1.025)
Urobilinogen, UA: 0.2 E.U./dL
pH, UA: 5.5 (ref 5.0–8.0)

## 2022-09-26 MED ORDER — PREDNISONE 20 MG PO TABS: 40.0000 mg | ORAL_TABLET | Freq: Every day | ORAL | 0 refills | Status: DC

## 2022-09-26 MED ORDER — MEGESTROL ACETATE 40 MG PO TABS: 40.0000 mg | ORAL_TABLET | Freq: Two times a day (BID) | ORAL | 0 refills | Status: AC

## 2022-09-26 NOTE — ED Triage Notes (Signed)
Patient states she has a rash to the soles of her feet, palms of her hands and left wrist, under her watch band x 2-3 days.  Patient states she has been on her period 3 months.Patient states she has been having abdominal cramping.  Patient states she has been using hydrocortisone cream for the rash.  Patient states she has been using pamprin for abdominal cramping and that has been greater than a month ago.

## 2022-09-30 NOTE — ED Provider Notes (Signed)
[facility]   [id number] 09/26/22 Arrival Time: 1501  ASSESSMENT & PLAN:  1. Rash and nonspecific skin eruption   2. Abnormal uterine bleeding    Results for orders placed or performed during the hospital encounter of 09/26/22  POCT urine pregnancy  Result Value Ref Range   Preg Test, Ur Negative Negative  POC urinalysis dipstick  Result Value Ref Range   Color, UA yellow yellow   Clarity, UA cloudy (A) clear   Glucose, UA negative negative mg/dL   Bilirubin, UA small (A) negative   Ketones, POC UA negative negative mg/dL   Spec Grav, UA >=3.151 (A) 1.010 - 1.025   Blood, UA moderate (A) negative   pH, UA 5.5 5.0 - 8.0   Protein Ur, POC negative negative mg/dL   Urobilinogen, UA 0.2 0.2 or 1.0 E.U./dL   Nitrite, UA Negative Negative   Leukocytes, UA Negative Negative   Unclear cause of rash; will see if it is steroid responsive. Begin megace. Meds ordered this encounter  Medications   predniSONE (DELTASONE) 20 MG tablet    Sig: Take 2 tablets (40 mg total) by mouth daily.    Dispense:  10 tablet    Refill:  0   megestrol (MEGACE) 40 MG tablet    Sig: Take 1 tablet (40 mg total) by mouth 2 (two) times daily.    Dispense:  60 tablet    Refill:  0    Follow-up Information     Schedule an appointment as soon as possible for a visit  with Erick Alley, DO.   Specialty: Family Medicine Contact information: [address] [place] [address] [phone]         Schedule an appointment as soon as possible for a visit  with [facility].   Specialty: Obstetrics and Gynecology Contact information: [address] [place] [address] [phone]                 Will follow up with PCP or here if worsening or failing to improve as anticipated. Reviewed expectations re: course of current medical issues. Questions answered. Outlined signs and symptoms indicating need for more  acute intervention. Patient verbalized understanding. After Visit Summary given.   SUBJECTIVE:  Alyssa Chang is a 22 y.o. female who presents with a skin complaint. Patient states she has a rash to the soles of her feet, palms of her hands and left wrist, under her watch band x 2-3 days. OTC hydrocortisone without relief. No tick exposure. Also sates she has been on her period 3 months; with mild abdominal cramping. Denies fever/chills/n/v/abdominal pain/back pain. Patient's last menstrual period was 06/27/2022.   OBJECTIVE: Vitals:   09/26/22 1516  BP: (!) 127/94  Pulse: (!) 109  Resp: 16  Temp: 98.5 F (36.9 C)  TempSrc: Oral  SpO2: 95%    Recheck P: 96 General appearance: alert; no distress HEENT: [place]; AT Neck: supple with FROM Lungs: clear to auscultation bilaterally Heart: regular rate and rhythm Extremities: no edema; moves all extremities normally Skin: warm and dry; scattered areas of slightly raised erythematous lesions on legs, feet, palms of hands and wrists; some on abdomen Psychological: alert and cooperative; normal mood and affect  Allergies  Allergen Reactions   Penicillins     Vomiting, dyspnea with amoxicillin    Past Medical History:  Diagnosis Date   Adjustment disorder with depressed mood    Allergic rhinitis  GERD (gastroesophageal reflux disease)    Pneumonia    Strep pharyngitis    Social History   Socioeconomic History   Marital status: Single    Spouse name: Not on file   Number of children: Not on file   Years of education: Not on file   Highest education level: Not on file  Occupational History   Not on file  Tobacco Use   Smoking status: Never   Smokeless tobacco: Never  Vaping Use   Vaping status: Every Day   Substances: Nicotine, Flavoring  Substance and Sexual Activity   Alcohol use: Yes   Drug use: Not Currently    Types: Marijuana   Sexual activity: Yes    Birth control/protection: None  Other Topics Concern    Not on file  Social History Narrative   Not on file   Social Determinants of Health   Financial Resource Strain: Not on file  Food Insecurity: Not on file  Transportation Needs: Not on file  Physical Activity: Not on file  Stress: Not on file  Social Connections: Not on file  Intimate Partner Violence: Not on file   Family History  Problem Relation Age of Onset   Alcohol abuse Father    Depression Mother    Past Surgical History:  Procedure Laterality Date   WISDOM TOOTH EXTRACTION        Mardella Layman, MD 09/30/22 1015

## 2023-01-20 ENCOUNTER — Other Ambulatory Visit: Payer: Self-pay

## 2023-01-20 ENCOUNTER — Encounter (HOSPITAL_COMMUNITY): Payer: Self-pay | Admitting: *Deleted

## 2023-01-20 ENCOUNTER — Ambulatory Visit (HOSPITAL_COMMUNITY)
Admission: EM | Admit: 2023-01-20 | Discharge: 2023-01-20 | Disposition: A | Discharge status: Home / Self Care | Payer: Medicaid Other | Attending: Internal Medicine | Admitting: Internal Medicine

## 2023-01-20 DIAGNOSIS — K529 Noninfective gastroenteritis and colitis, unspecified: Secondary | ICD-10-CM

## 2023-01-20 DIAGNOSIS — R42 Dizziness and giddiness: Secondary | ICD-10-CM | POA: Diagnosis not present

## 2023-01-20 DIAGNOSIS — Z3202 Encounter for pregnancy test, result negative: Secondary | ICD-10-CM | POA: Diagnosis not present

## 2023-01-20 LAB — POCT URINALYSIS DIP (MANUAL ENTRY)
Bilirubin, UA: NEGATIVE
Blood, UA: NEGATIVE
Glucose, UA: >=1000 mg/dL — AB
Ketones, POC UA: NEGATIVE mg/dL
Leukocytes, UA: NEGATIVE
Nitrite, UA: NEGATIVE
Protein Ur, POC: NEGATIVE mg/dL
Spec Grav, UA: 1.025 (ref 1.010–1.025)
Urobilinogen, UA: 0.2 U/dL
pH, UA: 6 (ref 5.0–8.0)

## 2023-01-20 LAB — POCT FASTING CBG KUC MANUAL ENTRY: POCT Glucose (KUC): 0 mg/dL — AB (ref 70–99)

## 2023-01-20 LAB — POCT URINE PREGNANCY: Preg Test, Ur: NEGATIVE

## 2023-01-20 MED ORDER — ALBUTEROL SULFATE HFA 108 (90 BASE) MCG/ACT IN AERS: 2.0000 | INHALATION_SPRAY | Freq: Four times a day (QID) | RESPIRATORY_TRACT | 1 refills | Status: DC | PRN

## 2023-01-20 MED ORDER — ONDANSETRON 4 MG PO TBDP
4.0000 mg | ORAL_TABLET | Freq: Once | ORAL | Status: AC
  Administered 2023-01-20: 4 mg via ORAL

## 2023-01-20 MED ORDER — ONDANSETRON 4 MG PO TBDP: 4.0000 mg | ORAL_TABLET | Freq: Four times a day (QID) | ORAL | 0 refills | Status: DC | PRN

## 2023-01-20 MED ORDER — ONDANSETRON 4 MG PO TBDP
ORAL_TABLET | ORAL | Status: AC
  Filled 2023-01-20: qty 1

## 2023-01-20 NOTE — ED Triage Notes (Signed)
PT reports her PCP told her to come to [facility] for refill on albuterol inhaler.

## 2023-01-20 NOTE — ED Notes (Signed)
Corrected CBG 100. Provider notified.
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
## 2023-01-20 NOTE — ED Triage Notes (Signed)
Pt reports feeling dizzy,nauseated and vomiting. Pt reports a possible pregnancy.

## 2023-01-20 NOTE — Discharge Instructions (Signed)
The zofran can be used every 6 hours as needed to settle the stomach Drink lots of fluids Bland diet as tolerated Please return if needed  I have refilled your albuterol inhaler  Follow up with primary care provider and ob/gyn

## 2023-02-28 ENCOUNTER — Encounter (HOSPITAL_COMMUNITY): Payer: Self-pay | Admitting: *Deleted

## 2023-02-28 ENCOUNTER — Ambulatory Visit (HOSPITAL_COMMUNITY)
Admission: EM | Admit: 2023-02-28 | Discharge: 2023-02-28 | Disposition: A | Discharge status: Home / Self Care | Payer: Medicaid Other | Attending: Emergency Medicine | Admitting: Emergency Medicine

## 2023-02-28 ENCOUNTER — Other Ambulatory Visit: Payer: Self-pay

## 2023-02-28 DIAGNOSIS — J069 Acute upper respiratory infection, unspecified: Secondary | ICD-10-CM | POA: Diagnosis present

## 2023-02-28 MED ORDER — PSEUDOEPHEDRINE HCL 60 MG PO TABS: 60.0000 mg | ORAL_TABLET | Freq: Four times a day (QID) | ORAL | 0 refills | Status: DC | PRN

## 2023-02-28 MED ORDER — ALBUTEROL SULFATE HFA 108 (90 BASE) MCG/ACT IN AERS: 2.0000 | INHALATION_SPRAY | Freq: Four times a day (QID) | RESPIRATORY_TRACT | 1 refills | Status: DC | PRN
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
## 2023-02-28 NOTE — ED Triage Notes (Addendum)
Pt reports she has had a HA since christmas and congestion. Pt wants to have a COVID test.

## 2023-02-28 NOTE — Discharge Instructions (Signed)
Sudafed can be used every 6 hours Continue mucinex (guaifenesin)   I have refilled your albuterol inhaler  We will call you if your covid test returns positive.   If no improvement in the next 4-5 days, please return

## 2023-03-01 LAB — SARS CORONAVIRUS 2 (TAT 6-24 HRS): SARS Coronavirus 2: NEGATIVE

## 2023-06-10 ENCOUNTER — Ambulatory Visit (HOSPITAL_COMMUNITY)
Admission: EM | Admit: 2023-06-10 | Discharge: 2023-06-10 | Disposition: A | Discharge status: Home / Self Care | Attending: Emergency Medicine | Admitting: Emergency Medicine

## 2023-06-10 ENCOUNTER — Encounter (HOSPITAL_COMMUNITY): Payer: Self-pay | Admitting: Emergency Medicine

## 2023-06-10 ENCOUNTER — Ambulatory Visit: Admitting: Student

## 2023-06-10 DIAGNOSIS — J4520 Mild intermittent asthma, uncomplicated: Secondary | ICD-10-CM | POA: Insufficient documentation

## 2023-06-10 DIAGNOSIS — Z113 Encounter for screening for infections with a predominantly sexual mode of transmission: Secondary | ICD-10-CM | POA: Diagnosis not present

## 2023-06-10 MED ORDER — ALBUTEROL SULFATE HFA 108 (90 BASE) MCG/ACT IN AERS: 2.0000 | INHALATION_SPRAY | Freq: Four times a day (QID) | RESPIRATORY_TRACT | 1 refills | Status: DC | PRN

## 2023-06-10 NOTE — Discharge Instructions (Addendum)
 We will call you if anything on your swab returns positive. You can also see these results on MyChart. Please abstain from sexual intercourse until your results return.  There are 2 OB/GYN clinics listed below you can call to make an appointment  I refilled your albuterol inhaler

## 2023-06-10 NOTE — Progress Notes (Deleted)

## 2023-06-10 NOTE — ED Triage Notes (Signed)
 Requesting STD testing since has new sexual partner. Denies s/s or known exposure.
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
## 2023-06-15 LAB — CERVICOVAGINAL ANCILLARY ONLY
Chlamydia: NEGATIVE
Comment: NEGATIVE
Comment: NEGATIVE
Comment: NORMAL
Neisseria Gonorrhea: NEGATIVE
Trichomonas: NEGATIVE

## 2023-06-16 ENCOUNTER — Encounter: Payer: Self-pay | Admitting: Obstetrics and Gynecology

## 2023-06-16 NOTE — Progress Notes (Unsigned)
 23 y.o. No obstetric history on file. Single Caucasian female here for NEW GYN/annual exam.    PCP: Erick Alley, DO   Patient's last menstrual period was 05/13/2023.           Sexually active: Yes.    The current method of family planning is OCP.    Menopausal hormone therapy:  n/a Exercising: {yes no:314532}  {types:19826} Smoker:  no  OB History  No obstetric history on file.     HEALTH MAINTENANCE: Last 2 paps:  n/a History of abnormal Pap or positive HPV:  no Mammogram:   n/a Colonoscopy:  n/a Bone Density:  n/a  Result  n/a   Immunization History  Administered Date(s) Administered   HPV Bivalent 02/09/2011   HPV Quadrivalent 02/12/2012, 03/06/2013   Hepatitis A 01/11/2007   Influenza Split 02/09/2011   Influenza Whole 01/11/2007, 01/13/2008   Influenza, Seasonal, Injecte, Preservative Fre 02/12/2012   Influenza,inj,Quad PF,6+ Mos 03/06/2013, 02/19/2014, 02/14/2016   Meningococcal Conjugate 02/09/2011   Meningococcal Mcv4o 02/14/2016   Tdap 02/09/2011      reports that she has never smoked. She has never used smokeless tobacco. She reports current alcohol use. She reports that she does not currently use drugs after having used the following drugs: Marijuana.  Past Medical History:  Diagnosis Date   Adjustment disorder with depressed mood    Allergic rhinitis    GERD (gastroesophageal reflux disease)    Pneumonia    Strep pharyngitis     Past Surgical History:  Procedure Laterality Date   HAND SURGERY     WISDOM TOOTH EXTRACTION      Current Outpatient Medications  Medication Sig Dispense Refill   albuterol (VENTOLIN HFA) 108 (90 Base) MCG/ACT inhaler Inhale 2 puffs into the lungs every 6 (six) hours as needed for wheezing or shortness of breath. 8 g 1   pseudoephedrine (SUDAFED) 60 MG tablet Take 1 tablet (60 mg total) by mouth every 6 (six) hours as needed for congestion. 30 tablet 0   No current facility-administered medications for this visit.     ALLERGIES: Penicillins  Family History  Problem Relation Age of Onset   Alcohol abuse Father    Depression Mother     Review of Systems  PHYSICAL EXAM:  LMP 05/13/2023     General appearance: alert, cooperative and appears stated age Head: normocephalic, without obvious abnormality, atraumatic Neck: no adenopathy, supple, symmetrical, trachea midline and thyroid normal to inspection and palpation Lungs: clear to auscultation bilaterally Breasts: normal appearance, no masses or tenderness, No nipple retraction or dimpling, No nipple discharge or bleeding, No axillary adenopathy Heart: regular rate and rhythm Abdomen: soft, non-tender; no masses, no organomegaly Extremities: extremities normal, atraumatic, no cyanosis or edema Skin: skin color, texture, turgor normal. No rashes or lesions Lymph nodes: cervical, supraclavicular, and axillary nodes normal. Neurologic: grossly normal  Pelvic: External genitalia:  no lesions              No abnormal inguinal nodes palpated.              Urethra:  normal appearing urethra with no masses, tenderness or lesions              Bartholins and Skenes: normal                 Vagina: normal appearing vagina with normal color and discharge, no lesions              Cervix: no lesions  Pap taken: {yes no:314532} Bimanual Exam:  Uterus:  normal size, contour, position, consistency, mobility, non-tender              Adnexa: no mass, fullness, tenderness              Rectal exam: {yes no:314532}.  Confirms.              Anus:  normal sphincter tone, no lesions  Chaperone was present for exam:  {BSCHAPERONE:31226::"Emily F, CMA"}  ASSESSMENT: Well woman visit with gynecologic exam.  PHQ-9: ***  ***  PLAN: Mammogram screening discussed. Self breast awareness reviewed. Pap and HRV collected:  {yes no:314532} Guidelines for Calcium, Vitamin D, regular exercise program including cardiovascular and weight bearing  exercise. Medication refills:  *** {LABS (Optional):23779} Follow up:  ***    Additional counseling given.  {yes [id number]. ***  total time was spent for this patient encounter, including preparation, face-to-face counseling with the patient, coordination of care, and documentation of the encounter in addition to doing the well woman visit with gynecologic exam.

## 2023-06-17 ENCOUNTER — Ambulatory Visit: Admitting: Obstetrics and Gynecology

## 2023-06-17 ENCOUNTER — Other Ambulatory Visit (HOSPITAL_COMMUNITY)
Admission: RE | Admit: 2023-06-17 | Discharge: 2023-06-17 | Disposition: A | Discharge status: Home / Self Care | Source: Ambulatory Visit | Attending: Obstetrics and Gynecology | Admitting: Obstetrics and Gynecology

## 2023-06-17 ENCOUNTER — Encounter: Payer: Self-pay | Admitting: Obstetrics and Gynecology

## 2023-06-17 VITALS — BP 126/80 | HR 93 | Ht 62.0 in | Wt 242.0 lb

## 2023-06-17 DIAGNOSIS — Z113 Encounter for screening for infections with a predominantly sexual mode of transmission: Secondary | ICD-10-CM

## 2023-06-17 DIAGNOSIS — Z124 Encounter for screening for malignant neoplasm of cervix: Secondary | ICD-10-CM

## 2023-06-17 DIAGNOSIS — Z1331 Encounter for screening for depression: Secondary | ICD-10-CM

## 2023-06-17 DIAGNOSIS — N946 Dysmenorrhea, unspecified: Secondary | ICD-10-CM | POA: Diagnosis not present

## 2023-06-17 DIAGNOSIS — Z1159 Encounter for screening for other viral diseases: Secondary | ICD-10-CM

## 2023-06-17 DIAGNOSIS — N921 Excessive and frequent menstruation with irregular cycle: Secondary | ICD-10-CM

## 2023-06-17 DIAGNOSIS — N926 Irregular menstruation, unspecified: Secondary | ICD-10-CM

## 2023-06-17 DIAGNOSIS — R635 Abnormal weight gain: Secondary | ICD-10-CM

## 2023-06-17 DIAGNOSIS — Z114 Encounter for screening for human immunodeficiency virus [HIV]: Secondary | ICD-10-CM

## 2023-06-17 DIAGNOSIS — Z01419 Encounter for gynecological examination (general) (routine) without abnormal findings: Secondary | ICD-10-CM

## 2023-06-17 NOTE — Patient Instructions (Signed)

## 2023-06-17 NOTE — Addendum Note (Signed)
 Addended by: Jorie Newness, Colonel Dears E on: 06/17/2023 01:54 PM   Modules accepted: Orders

## 2023-06-18 LAB — TSH: TSH: 4.18 m[IU]/L

## 2023-06-18 LAB — FSH/LH
FSH: 2.8 m[IU]/mL
LH: 1.9 m[IU]/mL

## 2023-06-18 LAB — HEPATITIS C ANTIBODY: Hepatitis C Ab: NONREACTIVE

## 2023-06-18 LAB — CYTOLOGY - PAP: Diagnosis: NEGATIVE

## 2023-06-18 LAB — RPR: RPR Ser Ql: NONREACTIVE

## 2023-06-18 LAB — PROLACTIN: Prolactin: 9.8 ng/mL

## 2023-06-18 LAB — CORTISOL: Cortisol, Plasma: 16.2 ug/dL

## 2023-06-18 LAB — ESTRADIOL: Estradiol: 46 pg/mL

## 2023-06-18 LAB — HIV ANTIBODY (ROUTINE TESTING W REFLEX): HIV 1&2 Ab, 4th Generation: NONREACTIVE

## 2023-06-21 ENCOUNTER — Encounter: Payer: Self-pay | Admitting: Obstetrics and Gynecology

## 2023-06-22 LAB — TESTOS,TOTAL,FREE AND SHBG (FEMALE)
Free Testosterone: 3.3 pg/mL (ref 0.1–6.4)
Sex Hormone Binding: 18 nmol/L (ref 17–124)
Testosterone, Total, LC-MS-MS: 21 ng/dL (ref 2–45)
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
## 2023-07-08 ENCOUNTER — Ambulatory Visit (INDEPENDENT_AMBULATORY_CARE_PROVIDER_SITE_OTHER): Admitting: Obstetrics and Gynecology

## 2023-07-08 ENCOUNTER — Ambulatory Visit

## 2023-07-08 ENCOUNTER — Encounter: Payer: Self-pay | Admitting: Obstetrics and Gynecology

## 2023-07-08 VITALS — BP 126/84 | HR 115 | Ht 62.0 in | Wt 242.0 lb

## 2023-07-08 DIAGNOSIS — N921 Excessive and frequent menstruation with irregular cycle: Secondary | ICD-10-CM

## 2023-07-08 DIAGNOSIS — Z319 Encounter for procreative management, unspecified: Secondary | ICD-10-CM | POA: Diagnosis not present

## 2023-07-08 DIAGNOSIS — N941 Unspecified dyspareunia: Secondary | ICD-10-CM | POA: Diagnosis not present

## 2023-07-08 DIAGNOSIS — N946 Dysmenorrhea, unspecified: Secondary | ICD-10-CM | POA: Diagnosis not present

## 2023-07-08 NOTE — Patient Instructions (Signed)
 Recurrent Pregnancy Loss Recurrent pregnancy loss is the loss of two or more pregnancies before 20 weeks of pregnancy (gestation). This may also be called recurrent or repeated miscarriage. What are the causes? The cause of this condition is usually not known. What increases the risk? The following factors may make a pregnant woman more likely to have recurrent pregnancy loss: Being older than age 24. Having certain medical conditions, such as diabetes, thyroid disease, autoimmune disease, or polycystic ovary syndrome. Having a condition called Asherman syndrome. This syndrome causes scarring in the uterus or causes the uterus to be abnormal in structure. Having a blood clotting disorder. Obesity Being underweight. Using products with tobacco or nicotine in them, using recreational drugs, or drinking alcohol during pregnancy. What are the signs or symptoms? Having two or more miscarriages. Symptoms of miscarriage include: Vaginal bleeding or spotting, with or without cramps or pain. Pain or cramping in the abdomen or lower back. Fluid or tissue coming out of the vagina. How is this diagnosed? This condition is diagnosed with: A physical exam. This will include a pelvic exam to check the vagina and uterus for possible causes of pregnancy loss. An ultrasound. This is done to: Confirm the pregnancy loss. See if the structure of the uterus is normal. Check for growths such as polyps or fibroids. You may also have other tests, including: Blood tests to see if you have a condition that causes recurrent pregnancy loss. Blood tests to see if your blood clots normally. Genetic tests of you and your partner. How is this treated? Treatment for this condition depends on the cause of the pregnancy loss. Possible treatments include: Lifestyle changes. These may include weight management and stopping use of substances such as cigarettes, drugs, or alcohol. Taking medicines to treat underlying causes  of pregnancy loss. Having surgery to correct the abnormality in your uterus. Having your eggs fertilized outside your uterus (in vitro fertilization). By doing this, a health care provider may be able to select eggs without chromosome abnormalities. Chromosomes are the structures inside cells that contain genes. Follow these instructions at home: Lifestyle Do not use any products that contain nicotine or tobacco, such as cigarettes, e-cigarettes, and chewing tobacco. If you need help quitting, ask your health care provider. Do not use drugs or alcohol. Eat a balanced diet rich in fresh fruits and vegetables, whole grains, low-fat dairy, and lean meats. Manage any long-term health conditions, such as diabetes or thyroid disease. Get at least 30 minutes of moderate exercise every day, such as biking or swimming. If you are overweight, ask your health care provider for support and resources to lose weight. Find ways to manage stress, such as: Journaling. Doing yoga or deep breathing. Spending time in nature. Practicing meditation. General instructions Take over-the-counter and prescription medicines only as told by your health care provider. Get support from friends and loved ones. Unexpected pregnancy loss can be a sad and stressful event. Consider meeting with a counselor, spiritual leader, or a pregnancy loss support group. Ask your health care provider about taking a folic acid supplement. Meet with a Dentist as part of genetic testing to understand the results of any testing. Keep all follow-up visits. This is important. Where to find more information [employer]ians [employer]necologists: acog.org [place] Department of Health and Cytogeneticist of Women's Health: http://hoffman.com/ Contact a health care provider if: You have been trying to get pregnant without success. You are struggling with sadness or depression. Get help  right away  if: You have feelings of sadness that take over your thoughts. You have thoughts of hurting yourself. If you ever feel like you may hurt yourself or others, or have thoughts about taking your own life, get help right away. Go to your nearest emergency department or: Call your local emergency services (911 in the [place] Call a suicide crisis helpline, such as the National Suicide Prevention Lifeline at 336-538-4580 or 988 in the [place] This is open 24 hours a day in the [place] Text the Crisis Text Line at (718)546-1691 (in the [place] Summary Recurrent pregnancy loss is the loss of two or more pregnancies before 20 weeks of pregnancy (gestation). The cause of recurrent pregnancy loss is usually not known. Get support from friends and loved ones. Unexpected pregnancy loss can be a sad and stressful event. This information is not intended to replace advice given to you by your health care provider. Make sure you discuss any questions you have with your health care provider. Document Revised: 09/11/2020 Document Reviewed: 08/18/2019 Elsevier Patient Education  2024 [employer].

## 2023-09-23 NOTE — Progress Notes (Signed)
 23 y.o. G43P0030 female here for UTI symptoms. Single.  Patient's last menstrual period was 09/07/2023. Period Duration (Days): 7-10 Period Pattern: Regular Menstrual Flow: Heavy Menstrual Control: Tampon Dysmenorrhea: (!) Severe Dysmenorrhea Symptoms: Nausea, Headache  She reports urinary pressure, frequency, and not able to empty bladder completely. Symptoms have been present for two days. She has tried using a douche, cranberry juice. Slacking on water intake.  No hx of UTI No vaginal sx, no pelvic or back pain, N/V, dysuria. Urine sample provided: Yes  Birth control: None Sexually active: Yes   OB History  Gravida Para Term Preterm AB Living  3    3   SAB IAB Ectopic Multiple Live Births  3        # Outcome Date GA Lbr Len/2nd Weight Sex Type Anes PTL Lv  3 SAB           2 SAB           1 SAB            Past Medical History:  Diagnosis Date   Adjustment disorder with depressed mood    Allergic rhinitis    GERD (gastroesophageal reflux disease)    Pneumonia    Strep pharyngitis    Past Surgical History:  Procedure Laterality Date   HAND SURGERY     WISDOM TOOTH EXTRACTION     Current Outpatient Medications on File Prior to Visit  Medication Sig Dispense Refill   albuterol  (VENTOLIN  HFA) 108 (90 Base) MCG/ACT inhaler Inhale 2 puffs into the lungs every 6 (six) hours as needed for wheezing or shortness of breath. 8 g 1   [DISCONTINUED] FLUoxetine  (PROZAC ) 20 MG tablet Take 1 tablet (20 mg total) by mouth daily. (Patient not taking: Reported on 12/20/2019) 30 tablet 3   [DISCONTINUED] norgestimate -ethinyl estradiol  (SPRINTEC 28) 0.25-35 MG-MCG tablet Take 1 tablet by mouth daily. (Patient not taking: Reported on 12/20/2019) 3 Package 3   No current facility-administered medications on file prior to visit.   Allergies  Allergen Reactions   Penicillins     Vomiting, dyspnea with amoxicillin       PE Today's Vitals   09/24/23 1133  BP: 118/80  Pulse: 97   Temp: 98.5 F (36.9 C)  TempSrc: Oral  SpO2: 98%  Weight: 253 lb (114.8 kg)   Body mass index is 46.27 kg/m.  Physical Exam Vitals reviewed.  Constitutional:      General: She is not in acute distress.    Appearance: Normal appearance.  HENT:     Head: Normocephalic and atraumatic.     Nose: Nose normal.  Eyes:     Extraocular Movements: Extraocular movements intact.     Conjunctiva/sclera: Conjunctivae normal.  Pulmonary:     Effort: Pulmonary effort is normal.  Abdominal:     General: There is no distension.     Palpations: Abdomen is soft.     Tenderness: There is no abdominal tenderness. There is no right CVA tenderness or left CVA tenderness.  Musculoskeletal:        General: Normal range of motion.     Cervical back: Normal range of motion.  Neurological:     General: No focal deficit present.     Mental Status: She is alert.  Psychiatric:        Mood and Affect: Mood normal.        Behavior: Behavior normal.      Assessment and Plan:        Urge  incontinence of urine -     Urinalysis,Complete w/RFL Culture  Lysed blood, possible kidney stones Encouraged increased water intake F/u Ucx   Vera LULLA Pa, MD

## 2023-09-24 ENCOUNTER — Ambulatory Visit: Admitting: Obstetrics and Gynecology

## 2023-09-24 ENCOUNTER — Encounter: Payer: Self-pay | Admitting: Obstetrics and Gynecology

## 2023-09-24 VITALS — BP 118/80 | HR 97 | Temp 98.5°F | Wt 253.0 lb

## 2023-09-24 DIAGNOSIS — N3941 Urge incontinence: Secondary | ICD-10-CM | POA: Diagnosis not present

## 2023-09-27 ENCOUNTER — Ambulatory Visit: Payer: Self-pay | Admitting: Obstetrics and Gynecology

## 2023-09-27 DIAGNOSIS — N3001 Acute cystitis with hematuria: Secondary | ICD-10-CM

## 2023-09-27 MED ORDER — NITROFURANTOIN MONOHYD MACRO 100 MG PO CAPS: 100.0000 mg | ORAL_CAPSULE | Freq: Two times a day (BID) | ORAL | 0 refills | Status: AC

## 2023-09-28 LAB — URINALYSIS, COMPLETE W/RFL CULTURE
Bilirubin Urine: NEGATIVE
Hyaline Cast: NONE SEEN /LPF
Ketones, ur: NEGATIVE
Nitrites, Initial: NEGATIVE
Protein, ur: NEGATIVE
Specific Gravity, Urine: 1.02 (ref 1.001–1.035)
pH: 5.5 (ref 5.0–8.0)

## 2023-09-28 LAB — URINE CULTURE
MICRO NUMBER:: 16746427
SPECIMEN QUALITY:: ADEQUATE

## 2023-09-28 LAB — CULTURE INDICATED

## 2023-10-17 ENCOUNTER — Telehealth: Admitting: Family Medicine

## 2023-10-17 NOTE — Progress Notes (Signed)
 Pt did not show for visit [facility]

## 2023-12-05 ENCOUNTER — Encounter (HOSPITAL_COMMUNITY): Payer: Self-pay

## 2023-12-05 ENCOUNTER — Ambulatory Visit (HOSPITAL_COMMUNITY)

## 2023-12-05 ENCOUNTER — Ambulatory Visit (HOSPITAL_COMMUNITY)
Admission: EM | Admit: 2023-12-05 | Discharge: 2023-12-05 | Disposition: A | Discharge status: Home / Self Care | Attending: Physician Assistant | Admitting: Physician Assistant

## 2023-12-05 DIAGNOSIS — G8929 Other chronic pain: Secondary | ICD-10-CM | POA: Diagnosis not present

## 2023-12-05 DIAGNOSIS — M25532 Pain in left wrist: Secondary | ICD-10-CM

## 2023-12-05 DIAGNOSIS — M25561 Pain in right knee: Secondary | ICD-10-CM | POA: Diagnosis not present

## 2023-12-05 NOTE — Discharge Instructions (Signed)
 The pain in your left wrist sounds like it could be carpal tunnel syndrome.  We have given you a wrist brace today to help.  Be sure to wear it at night time and when you are driving.  Follow up with Ortho if symptoms do not improve with treatment.  We gave you a knee brace for your right knee.  Follow up with Ortho if symptoms do not improve with treatment.
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
## 2023-12-05 NOTE — ED Triage Notes (Signed)
 Patient c/o left wrist pain x 2 weeks. Patient denies any injury. Patient states she is here hoping to get a brace since she uses her wrist quite often.  Patient also c/o right knee pain x a few month. Patient states she had an old injury and states it was possibly a torn tendon. Patient states she had a fall and caused the pain to return. Patient states she was wearing a knee brace and lost the knee brace and the pain is not getting any better.

## 2024-01-15 ENCOUNTER — Ambulatory Visit (HOSPITAL_COMMUNITY)
Admission: EM | Admit: 2024-01-15 | Discharge: 2024-01-15 | Disposition: A | Discharge status: Home / Self Care | Attending: Emergency Medicine | Admitting: Emergency Medicine

## 2024-01-15 ENCOUNTER — Encounter (HOSPITAL_COMMUNITY): Payer: Self-pay

## 2024-01-15 DIAGNOSIS — J4520 Mild intermittent asthma, uncomplicated: Secondary | ICD-10-CM

## 2024-01-15 DIAGNOSIS — J029 Acute pharyngitis, unspecified: Secondary | ICD-10-CM

## 2024-01-15 DIAGNOSIS — J069 Acute upper respiratory infection, unspecified: Secondary | ICD-10-CM

## 2024-01-15 LAB — POC COVID19/FLU A&B COMBO
Covid Antigen, POC: NEGATIVE
Influenza A Antigen, POC: NEGATIVE
Influenza B Antigen, POC: NEGATIVE

## 2024-01-15 LAB — POCT RAPID STREP A (OFFICE): Rapid Strep A Screen: NEGATIVE

## 2024-01-15 MED ORDER — CETIRIZINE HCL 10 MG PO TABS: 10.0000 mg | ORAL_TABLET | Freq: Every day | ORAL | 0 refills | Status: AC

## 2024-01-15 MED ORDER — ALBUTEROL SULFATE HFA 108 (90 BASE) MCG/ACT IN AERS: 1.0000 | INHALATION_SPRAY | Freq: Four times a day (QID) | RESPIRATORY_TRACT | 0 refills | Status: AC | PRN
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
## 2024-01-15 NOTE — Discharge Instructions (Signed)
 COVID, flu and strep testing were negative.  He was likely have a different viral illness.  You can alternate between 800 mg of ibuprofen  and 500 mg of Tylenol every 4-6 hours for your symptoms.  Warm saline gargles, tea with honey and cough drops can help with your sore throat.  Use the albuterol  inhaler as needed.  Use the cetirizine  to help with sneezing and any allergy symptoms.  Return to clinic for any new or urgent symptoms.

## 2024-01-15 NOTE — ED Triage Notes (Addendum)
 Patient states she has had nasal congestion for a few days and a sore throat that started yesterday.  Patient has used her albuterol  inhaler.  Patient is requesting Covd/flu testing.  Patient is also requesting an albuterol  refill.

## 2024-02-26 ENCOUNTER — Ambulatory Visit: Admission: EM | Admit: 2024-02-26 | Discharge: 2024-02-26 | Disposition: A | Discharge status: Home / Self Care

## 2024-02-26 ENCOUNTER — Encounter: Payer: Self-pay | Admitting: *Deleted

## 2024-02-26 DIAGNOSIS — R040 Epistaxis: Secondary | ICD-10-CM | POA: Diagnosis not present

## 2024-02-26 DIAGNOSIS — H6990 Unspecified Eustachian tube disorder, unspecified ear: Secondary | ICD-10-CM | POA: Diagnosis not present

## 2024-02-26 MED ORDER — PSEUDOEPHEDRINE HCL 30 MG PO TABS: 30.0000 mg | ORAL_TABLET | ORAL | 0 refills | Status: AC | PRN

## 2024-02-26 MED ORDER — FLUTICASONE PROPIONATE 50 MCG/ACT NA SUSP: 1.0000 | Freq: Every day | NASAL | 0 refills | Status: AC

## 2024-02-26 MED ORDER — PREDNISONE 50 MG PO TABS: ORAL_TABLET | ORAL | 0 refills | Status: AC

## 2024-02-26 NOTE — ED Triage Notes (Signed)
 Pt reports 7 nosebleeds in the last 10 hours. Bleeding is from the left nostril. Controlled at this time.   She also reports 2 days of muffled hearing left ear. Right ear pain since yesterday. Pain 3/10  She also has a headache with light sensitivity. She took night time off brand mucinex  2 days ago. Last dose of ibuprofen  was 2 days ago. Headache currently 2/10

## 2024-02-26 NOTE — Discharge Instructions (Signed)
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
## 2024-03-03 ENCOUNTER — Telehealth: Admitting: Family Medicine

## 2024-03-03 DIAGNOSIS — H60502 Unspecified acute noninfective otitis externa, left ear: Secondary | ICD-10-CM

## 2024-03-04 MED ORDER — CIPROFLOXACIN-DEXAMETHASONE 0.3-0.1 % OT SUSP: 4.0000 [drp] | Freq: Two times a day (BID) | OTIC | 0 refills | Status: AC

## 2024-03-04 NOTE — Progress Notes (Signed)
 E Visit for Swimmer's Ear  We are sorry that you are not feeling well. Here is how we plan to help!  I have prescribed: Ciprofloxin 0.2% and hydrocortisone 1% otic suspension 3 drops in affected ears twice daily for 7 days   In certain cases swimmer's ear may progress to a more serious bacterial infection of the middle or inner ear.  If you have a fever 102 and up and significantly worsening symptoms, this could indicate a more serious infection moving to the middle/inner and needs face to face evaluation in an office by a provider.  Your symptoms should improve over the next 3 days and should resolve in about 7 days.  HOME CARE:  Wash your hands frequently. Do not place the tip of the bottle on your ear or touch it with your fingers. You can take Acetominophen 650 mg every 4-6 hours as needed for pain.  If pain is severe or moderate, you can apply a heating pad (set on low) or hot water bottle (wrapped in a towel) to outer ear for 20 minutes.  This will also increase drainage. Avoid ear plugs Do not use Q-tips After showers, help the water run out by tilting your head to one side.  GET HELP RIGHT AWAY IF:  Fever is over 102.2 degrees. You develop progressive ear pain or hearing loss. Ear symptoms persist longer than 3 days after treatment.  MAKE SURE YOU:  Understand these instructions. Will watch your condition. Will get help right away if you are not doing well or get worse.  TO PREVENT SWIMMER'S EAR: Use a bathing cap or custom fitted swim molds to keep your ears dry. Towel off after swimming to dry your ears. Tilt your head or pull your earlobes to allow the water to escape your ear canal. If there is still water in your ears, consider using a hairdryer on the lowest setting.  Thank you for choosing an e-visit. Your e-visit answers were reviewed by a board certified advanced clinical practitioner to complete your personal care plan. Depending upon the condition, your plan  could have included both over the counter or prescription medications. Please review your pharmacy choice. Be sure that the pharmacy you have chosen is open so that you can pick up your prescription now.  If there is a problem you may message your provider in MyChart to have the prescription routed to another pharmacy. Your safety is important to us . If you have drug allergies check your prescription carefully.  For the next 24 hours, you can use MyChart to ask questions about todays visit, request a non-urgent call back, or ask for a work or school excuse from your e-visit provider. You will get an email in the next two days asking about your experience. I hope that your e-visit has been valuable and will speed your recovery.   I have spent 5 minutes in review of e-visit questionnaire, review and updating patient chart, medical decision making and response to patient.   Jamis Kryder, FNP

## 2024-04-19 ENCOUNTER — Ambulatory Visit: Admitting: Obstetrics and Gynecology

## 2024-06-20 ENCOUNTER — Ambulatory Visit: Admitting: Obstetrics and Gynecology
